# Patient Record
Sex: Female | Born: 1970 | Race: Black or African American | Hispanic: No | Marital: Married | Smoking: Never smoker
Health system: Southern US, Community
[De-identification: ages and names within clinical notes are randomized; demographics above are authoritative.]

## PROBLEM LIST (undated history)

## (undated) DIAGNOSIS — E870 Hyperosmolality and hypernatremia: Secondary | ICD-10-CM

## (undated) DIAGNOSIS — D509 Iron deficiency anemia, unspecified: Secondary | ICD-10-CM

## (undated) DIAGNOSIS — I10 Essential (primary) hypertension: Secondary | ICD-10-CM

## (undated) DIAGNOSIS — E559 Vitamin D deficiency, unspecified: Secondary | ICD-10-CM

## (undated) HISTORY — DX: Essential (primary) hypertension: I10

## (undated) HISTORY — DX: Hyperosmolality and hypernatremia: E87.0

## (undated) HISTORY — PX: CYST EXCISION: SHX5701

## (undated) HISTORY — DX: Vitamin D deficiency, unspecified: E55.9

## (undated) HISTORY — DX: Iron deficiency anemia, unspecified: D50.9

## (undated) HISTORY — PX: TUBAL LIGATION: SHX77

---

## 2012-06-11 ENCOUNTER — Encounter: Payer: Self-pay | Admitting: *Deleted

## 2012-06-12 ENCOUNTER — Institutional Professional Consult (permissible substitution): Payer: Self-pay | Admitting: Family Medicine

## 2012-06-12 ENCOUNTER — Encounter: Payer: Self-pay | Admitting: *Deleted

## 2012-07-07 ENCOUNTER — Encounter: Payer: Self-pay | Admitting: Family Medicine

## 2012-07-07 ENCOUNTER — Ambulatory Visit (INDEPENDENT_AMBULATORY_CARE_PROVIDER_SITE_OTHER): Payer: BC Managed Care – PPO | Admitting: Family Medicine

## 2012-07-07 VITALS — BP 122/84 | HR 80 | Ht 68.0 in | Wt 178.0 lb

## 2012-07-07 DIAGNOSIS — R5383 Other fatigue: Secondary | ICD-10-CM

## 2012-07-07 DIAGNOSIS — N920 Excessive and frequent menstruation with regular cycle: Secondary | ICD-10-CM

## 2012-07-07 DIAGNOSIS — Z13220 Encounter for screening for lipoid disorders: Secondary | ICD-10-CM

## 2012-07-07 DIAGNOSIS — E559 Vitamin D deficiency, unspecified: Secondary | ICD-10-CM

## 2012-07-07 DIAGNOSIS — I10 Essential (primary) hypertension: Secondary | ICD-10-CM

## 2012-07-07 DIAGNOSIS — Z23 Encounter for immunization: Secondary | ICD-10-CM

## 2012-07-07 MED ORDER — AMLODIPINE BESYLATE 5 MG PO TABS: 5.0000 mg | ORAL_TABLET | Freq: Every day | ORAL | Status: DC

## 2012-07-07 NOTE — Progress Notes (Signed)
Chief Complaint  Patient presents with  . Establish Care    and discuss bp.    HPI: patient presents to establish care.  Wondering if she will ever be able to come off of her BP meds.  She has been on medication for 2 years.  BP's at pharmacy run 125/75-80. She denies headaches, dizziness, chest pain, palpitations or edema.  She denies any side effects to her medications.  Previously was followed by clinic in [place]--records are available.  Last labs approx 2 years ago.  Menses--heavy x first 2 days, occur monthly.  H/o anemia in past (mild).  H/o vitamin D deficiency in past.  Past Medical History  Diagnosis Date  . Hypertension   . Serum sodium elevated     hx of  . Unspecified vitamin D deficiency    Past Surgical History  Procedure Date  . Tubal ligation   . Cyst excision     left shoulder and left leg   Family History  Problem Relation Age of Onset  . Diabetes Mother   . Hyperlipidemia Mother   . Hypertension Mother   . Arthritis Mother   . Diabetes Father   . Hyperlipidemia Father   . Hypertension Father   . Coronary artery disease Maternal Aunt     died at 74  . Diabetes Maternal Grandmother   . Arthritis Maternal Grandmother   . Coronary artery disease Maternal Grandfather     50's  . Diabetes Maternal Grandfather   . Diabetes Paternal Grandmother   . Cancer Paternal Grandfather     prostate  . Diabetes Paternal Grandfather    History   Social History  . Marital Status: Divorced    Spouse Name: N/A    Number of Children: 2  . Years of Education: N/A   Occupational History  . [employer]    Social History Main Topics  . Smoking status: Never Smoker   . Smokeless tobacco: Never Used  . Alcohol Use: No  . Drug Use: No  . Sexually Active: Not on file   Other Topics Concern  . Not on file   Social History Narrative   Lives with her 2 daughters.  Getting divorced (February 2014)   Current outpatient prescriptions:amLODipine  (NORVASC) 5 MG tablet, Take 1 tablet (5 mg total) by mouth daily., Disp: 90 tablet, Rfl: 3  No Known Allergies  ROS:  Denies fevers, URI symptoms, cough, shortness of breath, chest pain, nausea, vomiting, diarrhea, joint pains, urinary complaints, bleeding, bruising, edema, depression, anxiety, headaches, dizziness, or other concerns.  See HPI  PHYSICAL EXAM: BP 122/84  Pulse 80  Ht 5\' 8"  (1.727 m)  Wt 178 lb (80.74  kg)  BMI 27.06 kg/m2  LMP 06/29/2012 Well developed, pleasant female in no distress HEENT:  PERRL, EOMI, conjunctiva clear.  TM's and EAC's normal.  OP clear Neck: no lymphadenopathy, thyromegaly, mass or carotid bruit Heart: regular rate and rhythm without murmur Lungs: clear bilaterally without murmur Abdomen: soft, nontender, no organomegaly or mass Extremities: no edema, 2+ pulse Neuro: alert and oriented.  Cranial nerves grossly intact.  Normal strength, sensation, gait Psych: normal mood, affect, hygiene and grooming Skin: no visible rashes  ASSESSMENT/PLAN: 1. Essential hypertension, benign  Lipid panel, Comprehensive metabolic panel, amLODipine (NORVASC) 5 MG tablet  2. Unspecified vitamin D deficiency  Vitamin D 25 hydroxy  3. Heavy menses  CBC with Differential, TSH  4. Other malaise and fatigue  CBC with Differential, TSH  5. Screening  for lipoid disorders  Lipid panel  6. Need for prophylactic vaccination and inoculation against influenza  Flu vaccine greater than or equal to 3yo preservative free IM   HTN--well controlled, on medications.  Discussed reasons for continuing meds.  Advised that if BP's are persistently <100-110, or any dizziness, can try cutting tablet in half for 2-4 weeks while continuing to monitor BP's, and to increase dose back to full tablet if BP' rise >135-140/85-90.  Given that BP is perfectly controlled, and she isn't having any side effects, recommended that she remain at current dose, rather than trying to taper down dose.  Flu  shot today--pt convinced after counseling to get; encouraged to get in the fall in the future.  Records reviewed Will need TdaP at CPE in March  Return in March, as scheduled for CPE

## 2012-07-07 NOTE — Patient Instructions (Signed)
Continue to take your blood pressure medications. If you are having dizziness and BP consistently <100-110 systolic, let us know.    Expect to get a TdaP booster at your physical.  Return prior to your physical for fasting labs

## 2012-08-14 ENCOUNTER — Encounter: Payer: Self-pay | Admitting: Family Medicine

## 2012-08-14 ENCOUNTER — Ambulatory Visit (INDEPENDENT_AMBULATORY_CARE_PROVIDER_SITE_OTHER): Payer: BC Managed Care – PPO | Admitting: Family Medicine

## 2012-08-14 VITALS — BP 122/86 | HR 94 | Wt 185.0 lb

## 2012-08-14 DIAGNOSIS — M25519 Pain in unspecified shoulder: Secondary | ICD-10-CM

## 2012-08-14 NOTE — Progress Notes (Signed)
  Subjective:    Patient ID: Isabella Rodgers, female    DOB: 1970/12/28, 42 y.o.   MRN: [id number]  HPI She is here for evaluation of a five-day history of left shoulder pain. She states that the pain does radiate to the shoulder. Movement of the shoulder does make it worse. She does have a previous history of excision of a lipoma from the left shoulder.   Review of Systems     Objective:   Physical Exam Surgical scars noted. She is exquisitely tender over the distal aspect of the scar. Good motion of the shoulder passively without pain. No laxity noted. No tenderness to palpation of the joint.no joint laxity noted.       Assessment & Plan:  Left shoulder pain since he is exquisitely tender over the distal aspect of scarring wondering if she is getting ready to get out a suture. I explained this to her. Recommend conservative care at this point.

## 2012-09-08 ENCOUNTER — Other Ambulatory Visit: Payer: Self-pay | Admitting: Family Medicine

## 2012-09-08 ENCOUNTER — Other Ambulatory Visit: Payer: BC Managed Care – PPO

## 2012-09-08 DIAGNOSIS — N920 Excessive and frequent menstruation with regular cycle: Secondary | ICD-10-CM

## 2012-09-08 LAB — COMPREHENSIVE METABOLIC PANEL
Albumin: 4.1 g/dL (ref 3.5–5.2)
BUN: 15 mg/dL (ref 6–23)
CO2: 25 mEq/L (ref 19–32)
Glucose, Bld: 75 mg/dL (ref 70–99)
Potassium: 4.4 mEq/L (ref 3.5–5.3)
Sodium: 136 mEq/L (ref 135–145)
Total Protein: 7 g/dL (ref 6.0–8.3)

## 2012-09-08 LAB — CBC WITH DIFFERENTIAL/PLATELET
Basophils Absolute: 0 10*3/uL (ref 0.0–0.1)
Eosinophils Absolute: 0.1 10*3/uL (ref 0.0–0.7)
Eosinophils Relative: 1 % (ref 0–5)
Lymphocytes Relative: 35 % (ref 12–46)
Lymphs Abs: 2.3 10*3/uL (ref 0.7–4.0)
MCV: 79 fL (ref 78.0–100.0)
Neutrophils Relative %: 56 % (ref 43–77)
Platelets: 246 10*3/uL (ref 150–400)
RBC: 4.14 MIL/uL (ref 3.87–5.11)
RDW: 14.6 % (ref 11.5–15.5)
WBC: 6.6 10*3/uL (ref 4.0–10.5)

## 2012-09-08 LAB — LIPID PANEL
Cholesterol: 145 mg/dL (ref 0–200)
HDL: 53 mg/dL (ref >39)

## 2012-09-08 LAB — TSH: TSH: 1.267 u[IU]/mL (ref 0.350–4.500)

## 2012-09-09 LAB — FOLATE: Folate: >20 ng/mL

## 2012-09-09 LAB — VITAMIN B12: Vitamin B-12: 607 pg/mL (ref 211–911)

## 2012-09-09 LAB — FERRITIN: Ferritin: 2 ng/mL — ABNORMAL LOW (ref 10–291)

## 2012-09-09 LAB — VITAMIN D 25 HYDROXY (VIT D DEFICIENCY, FRACTURES): Vit D, 25-Hydroxy: 19 ng/mL — ABNORMAL LOW (ref 30–89)

## 2012-09-17 ENCOUNTER — Encounter: Payer: Self-pay | Admitting: Family Medicine

## 2012-09-17 ENCOUNTER — Ambulatory Visit (INDEPENDENT_AMBULATORY_CARE_PROVIDER_SITE_OTHER): Payer: BC Managed Care – PPO | Admitting: Family Medicine

## 2012-09-17 VITALS — BP 120/84 | HR 72 | Ht 68.0 in | Wt 188.0 lb

## 2012-09-17 DIAGNOSIS — D509 Iron deficiency anemia, unspecified: Secondary | ICD-10-CM

## 2012-09-17 DIAGNOSIS — I10 Essential (primary) hypertension: Secondary | ICD-10-CM

## 2012-09-17 DIAGNOSIS — Z0000 Encounter for general adult medical examination without abnormal findings: Secondary | ICD-10-CM

## 2012-09-17 DIAGNOSIS — Z23 Encounter for immunization: Secondary | ICD-10-CM

## 2012-09-17 DIAGNOSIS — E559 Vitamin D deficiency, unspecified: Secondary | ICD-10-CM

## 2012-09-17 LAB — POCT URINALYSIS DIPSTICK
Blood, UA: NEGATIVE
Nitrite, UA: NEGATIVE
Protein, UA: NEGATIVE
Spec Grav, UA: 1.02
Urobilinogen, UA: NEGATIVE
pH, UA: 5

## 2012-09-17 MED ORDER — ERGOCALCIFEROL 1.25 MG (50000 UT) PO CAPS: 50000.0000 [IU] | ORAL_CAPSULE | ORAL | Status: DC

## 2012-09-17 NOTE — Patient Instructions (Signed)
HEALTH MAINTENANCE RECOMMENDATIONS:  It is recommended that you get at least 30 minutes of aerobic exercise at least 5 days/week (for weight loss, you may need as much as 60-90 minutes). This can be any activity that gets your heart rate up. This can be divided in 10-15 minute intervals if needed, but try and build up your endurance at least once a week.  Weight bearing exercise is also recommended twice weekly.  Eat a healthy diet with lots of vegetables, fruits and fiber.  "Colorful" foods have a lot of vitamins (ie green vegetables, tomatoes, red peppers, etc).  Limit sweet tea, regular sodas and alcoholic beverages, all of which has a lot of calories and sugar.  Up to 1 alcoholic drink daily may be beneficial for women (unless trying to lose weight, watch sugars).  Drink a lot of water.  Calcium recommendations are 1200-1500 mg daily (1500 mg for postmenopausal women or women without ovaries), and vitamin D 1000 IU daily.  This should be obtained from diet and/or supplements (vitamins), and calcium should not be taken all at once, but in divided doses.  Monthly self breast exams and yearly mammograms for women over the age of 65 is recommended.  Sunscreen of at least SPF 30 should be used on all sun-exposed parts of the skin when outside between the hours of 10 am and 4 pm (not just when at beach or pool, but even with exercise, golf, tennis, and yard work!)  Use a sunscreen that says "broad spectrum" so it covers both UVA and UVB rays, and make sure to reapply every 1-2 hours.  Remember to change the batteries in your smoke detectors when changing your clock times in the spring and fall.  Use your seat belt every time you are in a car, and please drive safely and not be distracted with cell phones and texting while driving.   Vitamin D-OH 19 Lab Results  Component Value Date   WBC 6.6 09/08/2012   HGB 10.7* 09/08/2012   HCT 32.7* 09/08/2012   MCV 79.0 09/08/2012   PLT 246 09/08/2012   Lab  Results  Component Value Date   IRON 36* 09/08/2012   FERRITIN 2* 09/08/2012   Normal B12 and folate    Chemistry      Component Value Date/Time   NA 136 09/08/2012 0843   K 4.4 09/08/2012 0843   CL 104 09/08/2012 0843   CO2 25 09/08/2012 0843   BUN 15 09/08/2012 0843   CREATININE 0.69 09/08/2012 0843      Component Value Date/Time   CALCIUM 8.7 09/08/2012 0843   ALKPHOS 61 09/08/2012 0843   AST 25 09/08/2012 0843   ALT 30 09/08/2012 0843   BILITOT 0.3 09/08/2012 0843     Lab Results  Component Value Date   TSH 1.267 09/08/2012   Lab Results  Component Value Date   CHOL 145 09/08/2012   HDL 53 09/08/2012   LDLCALC 83 09/08/2012   TRIG 43 09/08/2012   CHOLHDL 2.7 09/08/2012    Take prescription vitamin D once weekly for 3 months. Once you finish the prescription, make sure to be taking 1000-2000 IU daily (between all sources--ie calcium +D, vs separate D vs multi-vitamin).  Start taking OTC ferrous sulfate (324 mg) once daily with food.  It is absorbed better if taken with orange juice.  After 3-4 months of taking iron daily, you can likely switch to just taking it while you are on your menstrual cycle.  If you  develop constipation from the iron supplement, take Colace daily along with it (stool softener).

## 2012-09-17 NOTE — Progress Notes (Signed)
Chief Complaint  Patient presents with  . Annual Exam    nonfasting annual exam with pap. Would like to discuss appetite suppressant.    Isabella Rodgers is a 42 y.o. female who presents for a complete physical.  She has the following concerns: 10 pound weight gain since last visit 2 months ago.  She admits to exercising less recently, and stress eating, related to stress of pending divorce.  Denies hair/skin changes, periods are regular.   She had labs prior to visit.  Some fatigue.  Periods a little heavy.  Health Maintenance: Immunization History  Administered Date(s) Administered  . Influenza, Seasonal, Injecte, Preservative Fre 07/07/2012  . Tdap 09/17/2012  last tetanus unknown--thinks she got when she stepped on a nail, through [facility], but records reviewed and no record found Last Pap smear:  Fall 2012.  H/o cervical polyps, which were removed.  Denies abnormal paps Last mammogram: several years ago Last colonoscopy: never Last DEXA: never Dentist: twice yearly Ophtho: yearly Exercise:  3x/week for an hour  Past Medical History  Diagnosis Date  . Hypertension   . Serum sodium elevated     hx of  . Unspecified vitamin D deficiency   . Iron deficiency anemia     Past Surgical History  Procedure Laterality Date  . Tubal ligation    . Cyst excision      left shoulder and left leg    History   Social History  . Marital Status: Divorced    Spouse Name: N/A    Number of Children: 2  . Years of Education: N/A   Occupational History  . [employer]    Social History Main Topics  . Smoking status: Never Smoker   . Smokeless tobacco: Never Used  . Alcohol Use: No  . Drug Use: No  . Sexually Active: Yes -- Female partner(s)   Other Topics Concern  . Not on file   Social History Narrative   Lives with her 2 daughters.  Getting divorced (April 2014)    Family History  Problem Relation Age of Onset  . Diabetes Mother   .  Hyperlipidemia Mother   . Hypertension Mother   . Arthritis Mother   . Diabetes Father   . Hyperlipidemia Father   . Hypertension Father   . Coronary artery disease Maternal Aunt     died at 38  . Diabetes Maternal Grandmother   . Arthritis Maternal Grandmother   . Coronary artery disease Maternal Grandfather     50's  . Diabetes Maternal Grandfather   . Diabetes Paternal Grandmother   . Cancer Paternal Grandfather     prostate  . Diabetes Paternal Grandfather    Current Outpatient Prescriptions on File Prior to Visit  Medication Sig Dispense Refill  . amLODipine (NORVASC) 5 MG tablet Take 1 tablet (5 mg total) by mouth daily.  90 tablet  3   No current facility-administered medications on file prior to visit.   No Known Allergies  ROS:  The patient denies anorexia, fever, headaches,  vision changes, decreased hearing, ear pain, sore throat, breast concerns, chest pain, palpitations, dizziness, syncope, dyspnea on exertion, cough, swelling, nausea, vomiting, diarrhea, constipation, abdominal pain, melena, hematochezia, indigestion/heartburn, hematuria, incontinence, dysuria, irregular menstrual cycles, vaginal discharge, odor or itch, genital lesions, joint pains, numbness, tingling, weakness, tremor, suspicious skin lesions, depression, anxiety, abnormal bleeding/bruising, or enlarged lymph nodes. +weight gain (see HPI), slight fatigue.     PHYSICAL EXAM: BP 120/84  Pulse 72  Ht 5\' 8"  (1.727 m)  Wt 188 lb (85.276 kg)  BMI 28.59 kg/m2  LMP 08/24/2012  General Appearance:    Alert, cooperative, no distress, appears stated age  Head:    Normocephalic, without obvious abnormality, atraumatic  Eyes:    PERRL, conjunctiva/corneas clear, EOM's intact, fundi    benign  Ears:    Normal TM's and external ear canals  Nose:   Nares normal, mucosa normal, no drainage or sinus   tenderness  Throat:   Lips, mucosa, and tongue normal; teeth and gums normal  Neck:   Supple, no  lymphadenopathy;  thyroid:  no   enlargement/tenderness/nodules; no carotid   bruit or JVD  Back:    Spine nontender, no curvature, ROM normal, no CVA     tenderness  Lungs:     Clear to auscultation bilaterally without wheezes, rales or     ronchi; respirations unlabored  Chest Wall:    No tenderness or deformity   Heart:    Regular rate and rhythm, S1 and S2 normal, no murmur, rub   or gallop  Breast Exam:    No tenderness, masses, or nipple discharge or inversion.      No axillary lymphadenopathy  Abdomen:     Soft, non-tender, nondistended, normoactive bowel sounds,    no masses, no hepatosplenomegaly  Genitalia:    Normal external genitalia without lesions. No cervical motion tenderness. No abnormal vaginal discharge.  Uterus and adnexa not enlarged, nontender, no masses.  Pap not performed  Rectal:    Not performed due to age<40 and no related complaints  Extremities:   No clubbing, cyanosis or edema  Pulses:   2+ and symmetric all extremities  Skin:   Skin color, texture, turgor normal, no rashes or lesions  Lymph nodes:   Cervical, supraclavicular, and axillary nodes normal  Neurologic:   CNII-XII intact, normal strength, sensation and gait; reflexes 2+ and symmetric throughout          Psych:   Normal mood, affect, hygiene and grooming.    Lab results:  Vitamin D-OH 19 Lab Results  Component Value Date   WBC 6.6 09/08/2012   HGB 10.7* 09/08/2012   HCT 32.7* 09/08/2012   MCV 79.0 09/08/2012   PLT 246 09/08/2012   Lab Results  Component Value Date   IRON 36* 09/08/2012   FERRITIN 2* 09/08/2012   Normal B12 and folate    Chemistry      Component Value Date/Time   NA 136 09/08/2012 0843   K 4.4 09/08/2012 0843   CL 104 09/08/2012 0843   CO2 25 09/08/2012 0843   BUN 15 09/08/2012 0843   CREATININE 0.69 09/08/2012 0843      Component Value Date/Time   CALCIUM 8.7 09/08/2012 0843   ALKPHOS 61 09/08/2012 0843   AST 25 09/08/2012 0843   ALT 30 09/08/2012 0843   BILITOT 0.3  09/08/2012 0843     Lab Results  Component Value Date   TSH 1.267 09/08/2012   Lab Results  Component Value Date   CHOL 145 09/08/2012   HDL 53 09/08/2012   LDLCALC 83 09/08/2012   TRIG 43 09/08/2012   CHOLHDL 2.7 09/08/2012   ASSESSMENT/PLAN:  Routine general medical examination at a health care facility - Plan: Visual acuity screening, POCT Urinalysis Dipstick  Unspecified vitamin D deficiency - Plan: ergocalciferol (VITAMIN D2) 50000 UNITS capsule, Vitamin D 25 hydroxy  Essential hypertension, benign  Anemia, iron deficiency - Plan: CBC with Differential, Ferritin  Need for Tdap vaccination - Plan: Tdap vaccine greater than or equal to 7yo IM  Discussed monthly self breast exams and yearly mammograms after the age of 84; at least 30 minutes of aerobic activity at least 5 days/week; proper sunscreen use reviewed; healthy diet, including goals of calcium and vitamin D intake and alcohol recommendations (less than or equal to 1 drink/day) reviewed; regular seatbelt use; changing batteries in smoke detectors.  Immunization recommendations discussed--Tdap given today.  Colonoscopy recommendations reviewed, age 55, (sooner prn) Pap next year

## 2013-02-05 ENCOUNTER — Encounter: Payer: Self-pay | Admitting: Family Medicine

## 2013-02-05 ENCOUNTER — Ambulatory Visit (INDEPENDENT_AMBULATORY_CARE_PROVIDER_SITE_OTHER): Payer: BC Managed Care – PPO | Admitting: Family Medicine

## 2013-02-05 VITALS — BP 110/80 | HR 112 | Wt 194.0 lb

## 2013-02-05 DIAGNOSIS — R51 Headache: Secondary | ICD-10-CM

## 2013-02-05 NOTE — Patient Instructions (Signed)
800 mg of ibuprofen 3 times a day for the next 7-10 days. If this doesn't work then we will proceed further

## 2013-02-05 NOTE — Progress Notes (Signed)
  Subjective:    Patient ID: Isabella Rodgers, female    DOB: [birth year], 42 y.o.   MRN: [id number]  HPI She has a ten-day history of sharp stabbing left temporal pain. The pain lasts for 30 seconds and then goes away. She tends to notice them more often at night . He can get 5 or 6 of these during the day but more frequently at night. No blurred vision, nausea double vision, unilateral diaphoresis,, weakness, numbness or tingling. No previous history of this. She is a few days away from starting her next menstrual cycle. She has no history of headaches/migraine/family history of having headaches. She does not smoke or drink. Her medications were reviewed.   Review of Systems     Objective:   Physical Exam alert and in no distress. Tympanic membranes and canals are normal. Throat is clear. Tonsils are normal. Neck is supple without adenopathy or thyromegaly. Cardiac exam shows a regular sinus rhythm without murmurs or gallops. Lungs are clear to auscultation. EOMI. DTRs normal other cranial nerves intact. No tenderness to palpation over the temporal artery.        Assessment & Plan:  Headache, temporal  I explained that her headache did not fit a particular pattern but did have cluster component to it. I will initially treat her with 800 mg 3 times a day of Motrin. She will let me know how she tolerates this

## 2013-03-16 ENCOUNTER — Other Ambulatory Visit: Payer: BC Managed Care – PPO

## 2013-03-16 DIAGNOSIS — D509 Iron deficiency anemia, unspecified: Secondary | ICD-10-CM

## 2013-03-16 DIAGNOSIS — E559 Vitamin D deficiency, unspecified: Secondary | ICD-10-CM

## 2013-03-16 LAB — CBC WITH DIFFERENTIAL/PLATELET
Basophils Absolute: 0 10*3/uL (ref 0.0–0.1)
Basophils Relative: 0 % (ref 0–1)
Eosinophils Absolute: 0.1 10*3/uL (ref 0.0–0.7)
HCT: 34.1 % — ABNORMAL LOW (ref 36.0–46.0)
Hemoglobin: 11.1 g/dL — ABNORMAL LOW (ref 12.0–15.0)
MCH: 27.1 pg (ref 26.0–34.0)
MCHC: 32.6 g/dL (ref 30.0–36.0)
Monocytes Absolute: 0.3 10*3/uL (ref 0.1–1.0)
Monocytes Relative: 5 % (ref 3–12)
RDW: 14.2 % (ref 11.5–15.5)

## 2013-03-31 ENCOUNTER — Encounter: Payer: Self-pay | Admitting: Family Medicine

## 2013-04-02 ENCOUNTER — Other Ambulatory Visit: Payer: Self-pay | Admitting: Family Medicine

## 2013-04-08 ENCOUNTER — Encounter: Payer: Self-pay | Admitting: Family Medicine

## 2013-04-08 ENCOUNTER — Ambulatory Visit (INDEPENDENT_AMBULATORY_CARE_PROVIDER_SITE_OTHER): Payer: BC Managed Care – PPO | Admitting: Family Medicine

## 2013-04-08 VITALS — BP 134/92 | HR 80 | Ht 69.0 in | Wt 200.0 lb

## 2013-04-08 DIAGNOSIS — I10 Essential (primary) hypertension: Secondary | ICD-10-CM

## 2013-04-08 DIAGNOSIS — D509 Iron deficiency anemia, unspecified: Secondary | ICD-10-CM

## 2013-04-08 DIAGNOSIS — E559 Vitamin D deficiency, unspecified: Secondary | ICD-10-CM

## 2013-04-08 MED ORDER — ERGOCALCIFEROL 1.25 MG (50000 UT) PO CAPS: 50000.0000 [IU] | ORAL_CAPSULE | ORAL | Status: DC

## 2013-04-08 NOTE — Patient Instructions (Addendum)
Vitamin D deficiency:  Refill rx for another 3 months of weekly therapy.  Once the prescription is completed, start taking 1000 IU of over-the-counter Vitamin D3 (this can be in addition to your daily Viactiv chew).  Anemia:  Likely related to heavy and frequent periods.  Restart iron--start at twice daily for a month.  Take with food or orange juice.  Consider taking stool softener along with the iron to prevent constipation.  After a month, you can cut back to taking iron just once daily, and only using twice daily on the heavy days of your periods.  Consider following up with your gynecologist to discuss options to help regulate the frequency and/or heaviness of your cycles (ie ablation, hormonal treatments, mirena IUD, etc).  Please return stool cards--looking for microscopic blood losses from the bowels, rather than blaming the menstrual losses as the sole cause of the anemia.  Do this when you are NOT having your period, any hemorrhoidal bleeding or other problems.  Get flu shot at work

## 2013-04-08 NOTE — Progress Notes (Signed)
Chief Complaint  Patient presents with  . discuss labs    discuss labs,   Patient presents to discuss her lab results.  She had labs to follow up on her vitamin D deficiency and iron deficiency, and was not found to be significantly improved.  Vitamin D deficiency.  She took the 3 months of prescription replacement, and since then has been taking 1 Viactiv chew daily. No other additional OTC vitamin D supplementation being taken.  Anemia--iron deficient:  She denies dizziness, fatigue or shortness of breath.  She reports that her periods were very frequent over the summer (8/16, then first week in September, then another one in the end of September).  First two days of her cycle are very heavy.  Last about 5 days.  She has iron on her nightstand, but hasn't been taking it.  She took it once daily for the three months that she was taking the prescription vitamin D.  She has had her tubes tied. She hasn't seen GYN in 2 years. She denies any pelvic pain.  Hypertension:  Doesn't check BP elsewhere.  Hasn't taken her amlodipine today or yesterday--misplaced it in the process of a move. Should be getting another refill in the mail any day now. Denies headache today.  Had one Monday when moving, much milder than at her last visit with Dr. Susann Givens.  No dizziness, chest pain, edema or other concerns.  Past Medical History  Diagnosis Date  . Hypertension   . Serum sodium elevated     hx of  . Unspecified vitamin D deficiency   . Iron deficiency anemia    Past Surgical History  Procedure Laterality Date  . Tubal ligation    . Cyst excision      left shoulder and left leg   History   Social History  . Marital Status: Married    Spouse Name: N/A    Number of Children: 2  . Years of Education: N/A   Occupational History  . [employer]    Social History Main Topics  . Smoking status: Never Smoker   . Smokeless tobacco: Never Used  . Alcohol Use: No  . Drug Use: No  . Sexual  Activity: Yes    Partners: Male   Other Topics Concern  . Not on file   Social History Narrative   Lives with her husband, and youngest daughter Joycelyn Rua); oldest daughter is in college in [place].  Divorced 07/2012; Remarried 01/2013   Current Outpatient Prescriptions on File Prior to Visit  Medication Sig Dispense Refill  . amLODipine (NORVASC) 5 MG tablet Take 1 tablet (5 mg total) by mouth daily.  90 tablet  3   No current facility-administered medications on file prior to visit.  Viactiv--one chew daily.  No Known Allergies  ROS:  Denies fevers, URI symptoms, chest pain, cough, shortness of breath, edema, dizziness.  No headache today.  No GI complaints, GU complaints other than heavy periods, and increase in frequency in the last 2 months.  Denies other bleeding, bruising, bowel change, rashes.  Moods are good.  +weight gain noted  PHYSICAL EXAM: BP 134/92  Pulse 80  Ht 5\' 9"  (1.753 m)  Wt 200 lb (90.719 kg)  BMI 29.52 kg/m2 134/92 on repeat by MD Very pleasant female, in no distress HEENT: PERRL, EOMI, conjunctiva clear Neck: no lymphadenopathy, thyromegaly or mass Heart: regular rate and rhythm without murmur Lungs: clear bilaterally Extremities: no edema Neuro: alert and oriented, normal gait, cranial nerves  Lab Results  Component Value Date   WBC 6.3 03/16/2013   HGB 11.1* 03/16/2013   HCT 34.1* 03/16/2013   MCV 83.4 03/16/2013   PLT 222 03/16/2013   Lab Results  Component Value Date   FERRITIN 5* 03/16/2013   Vitamin D-OH 22  ASSESSMENT/PLAN:  Unspecified vitamin D deficiency - repeat 12 week prescription course, followed by higher amt of OTC Vitamin D longterm (1000 IU plus the viactiv daily). recheck 3 mos - Plan: ergocalciferol (VITAMIN D2) 50000 UNITS capsule, Vit D  25 hydroxy (rtn osteoporosis monitoring)  Anemia, iron deficiency - likely related to heavy/frequent menses.  given hemoccult kit to r/o other causes.  take iron BID x 1 month, then qd (but  BID when on menses).  recheck in 3 mos - Plan: CBC with Differential, Ferritin  Essential hypertension, benign - elevated today, likely related to not taking meds x 2 days.  restart meds.  discussed exercise, weight loss, low sodium diet  Vitamin D deficiency:  Refill rx for another 3 months of weekly therapy.  Once the prescription is completed, start taking 1000 IU of over-the-counter Vitamin D3 (this can be in addition to your daily Viactiv chew).  Anemia:  Likely related to heavy and frequent periods.  Restart iron--start at twice daily for a month.  Take with food or orange juice.  Consider taking stool softener along with the iron to prevent constipation.  After a month, you can cut back to taking iron just once daily, and only using twice daily on the heavy days of your periods.  Consider following up with your gynecologist to discuss options to help regulate the frequency and/or heaviness of your cycles (ie ablation, hormonal treatments, mirena IUD, etc).  Please return stool cards--looking for microscopic blood losses from the bowels, rather than blaming the menstrual losses as the sole cause of the anemia.  Do this when you are NOT having your period, any hemorrhoidal bleeding or other problems.  CBC, ferritin, Vitamin D in 3 months   25 minutes, more than 1/2 spent counseling

## 2013-04-14 ENCOUNTER — Other Ambulatory Visit (INDEPENDENT_AMBULATORY_CARE_PROVIDER_SITE_OTHER): Payer: BC Managed Care – PPO

## 2013-04-14 DIAGNOSIS — Z1211 Encounter for screening for malignant neoplasm of colon: Secondary | ICD-10-CM

## 2013-04-14 LAB — HEMOCCULT GUIAC POC 1CARD (OFFICE): Fecal Occult Blood, POC: NEGATIVE

## 2013-04-30 ENCOUNTER — Other Ambulatory Visit: Payer: Self-pay

## 2013-09-06 ENCOUNTER — Other Ambulatory Visit: Payer: Self-pay | Admitting: Family Medicine

## 2013-09-07 ENCOUNTER — Other Ambulatory Visit: Payer: Self-pay | Admitting: *Deleted

## 2013-09-07 DIAGNOSIS — E559 Vitamin D deficiency, unspecified: Secondary | ICD-10-CM

## 2013-09-07 DIAGNOSIS — D509 Iron deficiency anemia, unspecified: Secondary | ICD-10-CM

## 2013-09-11 ENCOUNTER — Other Ambulatory Visit: Payer: BC Managed Care – PPO

## 2013-09-11 DIAGNOSIS — D509 Iron deficiency anemia, unspecified: Secondary | ICD-10-CM

## 2013-09-11 DIAGNOSIS — E559 Vitamin D deficiency, unspecified: Secondary | ICD-10-CM

## 2013-09-11 LAB — CBC WITH DIFFERENTIAL/PLATELET
Basophils Absolute: 0 10*3/uL (ref 0.0–0.1)
Basophils Relative: 0 % (ref 0–1)
EOS ABS: 0.2 10*3/uL (ref 0.0–0.7)
Eosinophils Relative: 2 % (ref 0–5)
HCT: 36.6 % (ref 36.0–46.0)
Hemoglobin: 12.1 g/dL (ref 12.0–15.0)
LYMPHS PCT: 37 % (ref 12–46)
Lymphs Abs: 2.8 10*3/uL (ref 0.7–4.0)
MCH: 27.6 pg (ref 26.0–34.0)
MCHC: 33.1 g/dL (ref 30.0–36.0)
MCV: 83.6 fL (ref 78.0–100.0)
Monocytes Absolute: 0.5 10*3/uL (ref 0.1–1.0)
Monocytes Relative: 6 % (ref 3–12)
NEUTROS PCT: 55 % (ref 43–77)
Neutro Abs: 4.1 10*3/uL (ref 1.7–7.7)
PLATELETS: 260 10*3/uL (ref 150–400)
RBC: 4.38 MIL/uL (ref 3.87–5.11)
RDW: 13.5 % (ref 11.5–15.5)
WBC: 7.5 10*3/uL (ref 4.0–10.5)

## 2013-09-12 LAB — FERRITIN: FERRITIN: 16 ng/mL (ref 10–291)

## 2013-09-12 LAB — VITAMIN D 25 HYDROXY (VIT D DEFICIENCY, FRACTURES): Vit D, 25-Hydroxy: 28 ng/mL — ABNORMAL LOW (ref 30–89)

## 2013-09-13 ENCOUNTER — Encounter: Payer: Self-pay | Admitting: Family Medicine

## 2013-09-28 HISTORY — PX: ENDOMETRIAL ABLATION: SHX621

## 2013-10-19 ENCOUNTER — Ambulatory Visit (INDEPENDENT_AMBULATORY_CARE_PROVIDER_SITE_OTHER): Payer: BC Managed Care – PPO | Admitting: Family Medicine

## 2013-10-19 ENCOUNTER — Encounter: Payer: Self-pay | Admitting: Family Medicine

## 2013-10-19 VITALS — BP 130/88 | HR 68 | Ht 69.0 in | Wt 204.0 lb

## 2013-10-19 DIAGNOSIS — D509 Iron deficiency anemia, unspecified: Secondary | ICD-10-CM

## 2013-10-19 DIAGNOSIS — I10 Essential (primary) hypertension: Secondary | ICD-10-CM

## 2013-10-19 DIAGNOSIS — E559 Vitamin D deficiency, unspecified: Secondary | ICD-10-CM

## 2013-10-19 LAB — COMPREHENSIVE METABOLIC PANEL
ALK PHOS: 65 U/L (ref 39–117)
ALT: 15 U/L (ref 0–35)
AST: 17 U/L (ref 0–37)
Albumin: 3.9 g/dL (ref 3.5–5.2)
BILIRUBIN TOTAL: 0.2 mg/dL (ref 0.2–1.2)
BUN: 18 mg/dL (ref 6–23)
CO2: 26 mEq/L (ref 19–32)
Calcium: 9 mg/dL (ref 8.4–10.5)
Chloride: 105 mEq/L (ref 96–112)
Creat: 0.69 mg/dL (ref 0.50–1.10)
GLUCOSE: 90 mg/dL (ref 70–99)
Potassium: 4.2 mEq/L (ref 3.5–5.3)
Sodium: 137 mEq/L (ref 135–145)
Total Protein: 6.9 g/dL (ref 6.0–8.3)

## 2013-10-19 MED ORDER — AMLODIPINE BESYLATE 5 MG PO TABS: 5.0000 mg | ORAL_TABLET | Freq: Every day | ORAL | Status: DC

## 2013-10-19 NOTE — Progress Notes (Signed)
Chief Complaint  Patient presents with  . Hypertension    nonfasting med check.    Hypertension follow-up:  Blood pressures elsewhere are 127/70's last week at [facility].  Denies dizziness, headaches, chest pain.  Denies side effects of medications.  Iron deficiency anemia: She had endometrial ablation 3 weeks ago, complicated by infection, treated with Augmentin, and now doing well. This was done by her GYN in [place].  Vitamin D deficiency--she admits that she has not been taking any vitamin D supplements for quite a while.  Previously took viactiv.  Patient had labs done in March, presents to f/u on results.  Past Medical History  Diagnosis Date  . Hypertension   . Serum sodium elevated     hx of  . Unspecified vitamin D deficiency   . Iron deficiency anemia    Past Surgical History  Procedure Laterality Date  . Tubal ligation    . Cyst excision      left shoulder and left leg  . Endometrial ablation  09/28/2013   History   Social History  . Marital Status: Married    Spouse Name: N/A    Number of Children: 2  . Years of Education: N/A   Occupational History  . [employer]    Social History Main Topics  . Smoking status: Never Smoker   . Smokeless tobacco: Never Used  . Alcohol Use: No  . Drug Use: No  . Sexual Activity: Yes    Partners: Male   Other Topics Concern  . Not on file   Social History Narrative   Lives with her husband, and youngest daughter Isabella Rodgers); oldest daughter is in college in [place].  Divorced 07/2012; Remarried 01/2013   Outpatient Encounter Prescriptions as of 10/19/2013  Medication Sig Note  . amLODipine (NORVASC) 5 MG tablet Take 1 tablet (5 mg total) by mouth daily.   . Calcium-Vitamin D-Vitamin K (VIACTIV) 500-370-48 MG-UNT-MCG CHEW Chew 1 tablet by mouth daily. 10/19/2013: Hasn't taken in a while  . [DISCONTINUED] ergocalciferol (VITAMIN D2) 50000 UNITS capsule Take 1 capsule (50,000 Units total) by mouth once a week.    No  Known Allergies  ROS:  Denies fevers, chills, headaches, dizziness, chest pain, palpitations, URI symptoms, cough, shortness of breath, edema, GI complaints; currently without GU complaints.  Denies bleeding, bruising, rash  PHYSICAL EXAM: BP 130/88  Pulse 68  Ht _0  (1.753 m)  Wt 204 lb (92.534 kg)  BMI 30.11 kg/m2 120/86 on repeat by MD, [place] Well developed, pleasant female in no distress Neck: no lymphadenopathy, thyromegaly or mass Heart: regular rate and rhythm without murmur Lungs: clear bilaterally Abdomen: soft, nontender, no organomegaly or mass Extremities: no edema Psych: normal mood, affect, hygiene and grooming Neuro: alert and oriented.  Cranial nerves intact, normal strength, sensation, gait.   Lab Results  Component Value Date   WBC 7.5 09/11/2013   HGB 12.1 09/11/2013   HCT 36.6 09/11/2013   MCV 83.6 09/11/2013   PLT 260 09/11/2013   Ferritin 16 (this was done prior to ablation)  Vitamin D-OH 28  ASSESSMENT/PLAN:  Essential hypertension, benign - controlled - Plan: amLODipine (NORVASC) 5 MG tablet, Comprehensive metabolic panel  Unspecified vitamin D deficiency - improved, mild.  restart OTC Vitamin D 1000-2000 IU daily.  discussed need for longterm replacement  Anemia, iron deficiency - this was resolving, even prior to her ablation   Last ate 4.5 hours ago Normal lipids last year  Check c-met otday   Ideally, f/u  in 6 months (f/u HTN, recheck vitamin D). If she is unable to get here, then as long as she is checking BP's elsewhere and are normal, and she is taking her vitamin D daily, then okay for CPE in 1 year

## 2013-10-19 NOTE — Patient Instructions (Signed)
Continue your current medications. Start taking a vitamin D supplement of 1000 IU (up to 2000).  You can take this as part of a calcium with D supplement if you aren't getting enough calcium in your diet; if you get 1200mg  of calcium daily in your diet, then just take a separate vitamin D.

## 2014-04-15 ENCOUNTER — Other Ambulatory Visit (INDEPENDENT_AMBULATORY_CARE_PROVIDER_SITE_OTHER): Payer: BC Managed Care – PPO

## 2014-04-15 DIAGNOSIS — Z23 Encounter for immunization: Secondary | ICD-10-CM

## 2014-04-16 ENCOUNTER — Other Ambulatory Visit: Payer: BC Managed Care – PPO

## 2014-04-26 ENCOUNTER — Encounter: Payer: Self-pay | Admitting: Family Medicine

## 2014-09-13 ENCOUNTER — Telehealth: Payer: Self-pay | Admitting: *Deleted

## 2014-09-13 NOTE — Telephone Encounter (Signed)
Since she is feeling back to normal, I recommend that she schedule visit to be seen this week (med check--since she hasn't been seen since 09/2013).  Have her periodically monitor her BP's and bring list to her visit.

## 2014-09-13 NOTE — Telephone Encounter (Signed)
Patient called and states that she had a little episode at work about an hour ago. Her left arm went numb for a few minutes and then about 5 minutes after she had trouble recalling simple words for a minute or two-called co-worker by wrong name. Her bp was 144/100 and then again a few minutes later 154/110, nurse at work took these. She feels fine now but wanted to call and let you know to see if there is anything she needs to do.

## 2014-09-27 ENCOUNTER — Encounter: Payer: Self-pay | Admitting: Family Medicine

## 2014-09-27 ENCOUNTER — Ambulatory Visit (INDEPENDENT_AMBULATORY_CARE_PROVIDER_SITE_OTHER): Payer: BLUE CROSS/BLUE SHIELD | Admitting: Family Medicine

## 2014-09-27 VITALS — BP 126/88 | HR 80 | Ht 69.0 in | Wt 212.0 lb

## 2014-09-27 DIAGNOSIS — E559 Vitamin D deficiency, unspecified: Secondary | ICD-10-CM

## 2014-09-27 DIAGNOSIS — D509 Iron deficiency anemia, unspecified: Secondary | ICD-10-CM | POA: Diagnosis not present

## 2014-09-27 DIAGNOSIS — R635 Abnormal weight gain: Secondary | ICD-10-CM

## 2014-09-27 DIAGNOSIS — I10 Essential (primary) hypertension: Secondary | ICD-10-CM | POA: Diagnosis not present

## 2014-09-27 LAB — CBC WITH DIFFERENTIAL/PLATELET
Basophils Absolute: 0 10*3/uL (ref 0.0–0.1)
Basophils Relative: 0 % (ref 0–1)
Eosinophils Absolute: 0.1 10*3/uL (ref 0.0–0.7)
Eosinophils Relative: 1 % (ref 0–5)
HCT: 39.5 % (ref 36.0–46.0)
HEMOGLOBIN: 13.1 g/dL (ref 12.0–15.0)
LYMPHS ABS: 3 10*3/uL (ref 0.7–4.0)
LYMPHS PCT: 36 % (ref 12–46)
MCH: 28.2 pg (ref 26.0–34.0)
MCHC: 33.2 g/dL (ref 30.0–36.0)
MCV: 84.9 fL (ref 78.0–100.0)
MONOS PCT: 6 % (ref 3–12)
MPV: 9.3 fL (ref 8.6–12.4)
Monocytes Absolute: 0.5 10*3/uL (ref 0.1–1.0)
NEUTROS PCT: 57 % (ref 43–77)
Neutro Abs: 4.8 10*3/uL (ref 1.7–7.7)
Platelets: 261 10*3/uL (ref 150–400)
RBC: 4.65 MIL/uL (ref 3.87–5.11)
RDW: 13.6 % (ref 11.5–15.5)
WBC: 8.4 10*3/uL (ref 4.0–10.5)

## 2014-09-27 LAB — LIPID PANEL
CHOLESTEROL: 135 mg/dL (ref 0–200)
HDL: 42 mg/dL — ABNORMAL LOW (ref >=46)
LDL Cholesterol: 76 mg/dL (ref 0–99)
Total CHOL/HDL Ratio: 3.2 Ratio
Triglycerides: 86 mg/dL (ref <150)
VLDL: 17 mg/dL (ref 0–40)

## 2014-09-27 LAB — COMPREHENSIVE METABOLIC PANEL
ALK PHOS: 59 U/L (ref 39–117)
ALT: 15 U/L (ref 0–35)
AST: 18 U/L (ref 0–37)
Albumin: 3.9 g/dL (ref 3.5–5.2)
BILIRUBIN TOTAL: 0.3 mg/dL (ref 0.2–1.2)
BUN: 12 mg/dL (ref 6–23)
CO2: 22 mEq/L (ref 19–32)
Calcium: 8.7 mg/dL (ref 8.4–10.5)
Chloride: 105 mEq/L (ref 96–112)
Creat: 0.71 mg/dL (ref 0.50–1.10)
Glucose, Bld: 77 mg/dL (ref 70–99)
Potassium: 4.7 mEq/L (ref 3.5–5.3)
SODIUM: 137 meq/L (ref 135–145)
TOTAL PROTEIN: 7.3 g/dL (ref 6.0–8.3)

## 2014-09-27 MED ORDER — AMLODIPINE BESYLATE 5 MG PO TABS: 7.5000 mg | ORAL_TABLET | Freq: Every day | ORAL | Status: DC

## 2014-09-27 NOTE — Progress Notes (Signed)
Chief Complaint  Patient presents with  . Hypertension    nonfasting med check. Patient did bring list of bp's with her today. States that numbers are better since phone call on 09/13/14. Was thinking about other factors that may have contributed to her "episode, " states that for the last few months she has had a few sharp pains/headaches.     Hypertension: She is compliant with taking the amlodipine daily.  Denies any side effects. She brings in a list of BP's over the last 2 weeks, ranging from 125/89 up to 154/110.  They are mostly 120's-130's/90.  Pulse ranges from 77-97.   She had an episode while she was on the phone talking to her boss (see documented phone call).  Her left arm was numb; she was talking on the phone and typing at the same time.  It felt tingly and weird, like it was asleep.  It lasted less than 5 minutes, no associated weakness.  She was on the phone with her boss, and she had trouble concentrating, couldn't think about what she was trying to say, but no trouble with her speech at all.  She went to the nurse at work, and that was when BP was 154/110.  She has had some headaches, but she "dismisses them" so can't tell us frequency.  Describes them as sharp pains in the frontal area, since December.  It would start with a sharp pain, then discomfort would linger, like a headache.  Relieved by ibuprofen after 30 minutes. This is located across the forehead.  Denies sniffles, sneeze, postnasal drip, just some last week when pollen was worse. She took an [facility], which helped. She has not been getting any regular exercise.  She should be starting with a Paramedic.  She had gotten down to 185# in December when she was exercising regularly and eating "rabbit food".  She hadn't been checking her blood pressures during that time.  Vitamin D deficiency:  She usually takes either a MVI or a Vitamin D 1000 IU, but has been out for the last month and a half or  so.  Iron deficiency anemia--had improved s/p ablation. She still has periods, they are light.  Denies fatigue, weakness, dizziness.  PMH, PSH, SH reviewed. Outpatient Encounter Prescriptions as of 09/27/2014  Medication Sig Note  . amLODipine (NORVASC) 5 MG tablet Take 1.5 tablets (7.5 mg total) by mouth daily.   . [DISCONTINUED] amLODipine (NORVASC) 5 MG tablet Take 1 tablet (5 mg total) by mouth daily.   . [DISCONTINUED] Calcium-Vitamin D-Vitamin K (VIACTIV) 998-338-25 MG-UNT-MCG CHEW Chew 1 tablet by mouth daily. 10/19/2013: Hasn't taken in a while   (amlodipine dose prior to today's visit was 44m daily)  No Known Allergies  ROS:  Denies fevers, chills, URI symptoms, cough, shortness of breath, chest pain, palpitations, nausea, vomiting, bowel changes, urinary complaints, edema or other complaints except as noted in HPI  PHYSICAL EXAM: BP 140/88 mmHg  Pulse 80  Ht 5' 9" (1.753 m)  Wt 212 lb (96.163 kg)  BMI 31.29 kg/m2 126/88 on repeat by MD Well developed, pleasant female in no distress Neck: c-spine nontender. No lymphadenopathy, thyromegaly or mass Heart: regular rate and rhythm without murmur Lungs: clear bilaterally Back: no CVA or spinal tenderness Abdomen: soft, nontender, no organomegaly or mass Extremities: nontender at elbows.  Negative phalen and tinel. 2+ pulses Neuro: Normal strength, sensation, DTR's are symmetric. Cranial nerves intact Psych: normal mood, affect, hygiene and grooming Skin:  no rashes  ASSESSMENT/PLAN  Essential hypertension, benign - suboptimally controlled (especially diastolic); low sodium diet, daily exercise, weight loss. increase dose to 7.5 (cut back if low BP's) - Plan: Lipid panel, Comprehensive metabolic panel, amLODipine (NORVASC) 5 MG tablet  Vitamin D deficiency - noncompliant with supplements.  recheck today - Plan: Vit D  25 hydroxy (rtn osteoporosis monitoring)  Anemia, iron deficiency - Plan: CBC with  Differential/Platelet  Weight gain - Plan: TSH   Discussed increasing amlodipine to 7.48m, vs adding HCTZ 12.562m vs not changing medication at this time, and looking closely at decreasing sodium in diet, getting back to a regular exercise routine, and trying to lose the weight that gained, and recheck in 2-3 months, or increase the medication prior to that if BP's remain high.  She plans to increase to the 7.41m53mbut will cut back to 41mg97mse if BP drops to <110/50 or any dizziness.   CBC, c-met, Vit D Lipid (last ate 5 hours ago)  F/u 3 months, sooner prn

## 2014-09-27 NOTE — Patient Instructions (Signed)
Increase the amlodipine to 1.5 tablets every day.  Try and lose the weight that you gained, and exercise at least 30 minutes daily. If your blood pressures is dropping to <110/50 or having frequent dizziness, then decrease the dose back to 5mg .  Continue to monitor your BP at home. Bring your list of BP's and your monitor (for [place] to verify the accuracy of the machine) to your next visit. Low sodium diet is also important in keeping the BP down.,  Low-Sodium Eating Plan Sodium raises blood pressure and causes water to be held in the body. Getting less sodium from food will help lower your blood pressure, reduce any swelling, and protect your heart, liver, and kidneys. We get sodium by adding salt (sodium chloride) to food. Most of our sodium comes from canned, boxed, and frozen foods. Restaurant foods, fast foods, and pizza are also very high in sodium. Even if you take medicine to lower your blood pressure or to reduce fluid in your body, getting less sodium from your food is important. WHAT IS MY PLAN? Most people should limit their sodium intake to 2,300 mg a day. Your health care provider recommends that you limit your sodium intake to __________ a day.  WHAT DO I NEED TO KNOW ABOUT THIS EATING PLAN? For the low-sodium eating plan, you will follow these general guidelines:  Choose foods with a % Daily Value for sodium of less than 5% (as listed on the food label).   Use salt-free seasonings or herbs instead of table salt or sea salt.   Check with your health care provider or pharmacist before using salt substitutes.   Eat fresh foods.  Eat more vegetables and fruits.  Limit canned vegetables. If you do use them, rinse them well to decrease the sodium.   Limit cheese to 1 oz (28 g) per day.   Eat lower-sodium products, often labeled as "lower sodium" or "no salt added."  Avoid foods that contain monosodium glutamate (MSG). MSG is sometimes added to [place] food and some canned  foods.  Check food labels (Nutrition Facts labels) on foods to learn how much sodium is in one serving.  Eat more home-cooked food and less restaurant, buffet, and fast food.  When eating at a restaurant, ask that your food be prepared with less salt or none, if possible.  HOW DO I READ FOOD LABELS FOR SODIUM INFORMATION? The Nutrition Facts label lists the amount of sodium in one serving of the food. If you eat more than one serving, you must multiply the listed amount of sodium by the number of servings. Food labels may also identify foods as:  Sodium free--Less than 5 mg in a serving.  Very low sodium--35 mg or less in a serving.  Low sodium--140 mg or less in a serving.  Light in sodium--50% less sodium in a serving. For example, if a food that usually has 300 mg of sodium is changed to become light in sodium, it will have 150 mg of sodium.  Reduced sodium--25% less sodium in a serving. For example, if a food that usually has 400 mg of sodium is changed to reduced sodium, it will have 300 mg of sodium. WHAT FOODS CAN I EAT? Grains Low-sodium cereals, including oats, puffed wheat and rice, and shredded wheat cereals. Low-sodium crackers. Unsalted rice and pasta. Lower-sodium bread.  Vegetables Frozen or fresh vegetables. Low-sodium or reduced-sodium canned vegetables. Low-sodium or reduced-sodium tomato sauce and paste. Low-sodium or reduced-sodium tomato and vegetable juices.  Fruits Fresh, frozen, and canned fruit. Fruit juice.  Meat and Other Protein Products Low-sodium canned tuna and salmon. Fresh or frozen meat, poultry, seafood, and fish. Lamb. Unsalted nuts. Dried beans, peas, and lentils without added salt. Unsalted canned beans. Homemade soups without salt. Eggs.  Dairy Milk. Soy milk. Ricotta cheese. Low-sodium or reduced-sodium cheeses. Yogurt.  Condiments Fresh and dried herbs and spices. Salt-free seasonings. Onion and garlic powders. Low-sodium varieties of  mustard and ketchup. Lemon juice.  Fats and Oils Reduced-sodium salad dressings. Unsalted butter.  Other Unsalted popcorn and pretzels.  The items listed above may not be a complete list of recommended foods or beverages. Contact your dietitian for more options. WHAT FOODS ARE NOT RECOMMENDED? Grains Instant hot cereals. Bread stuffing, pancake, and biscuit mixes. Croutons. Seasoned rice or pasta mixes. Noodle soup cups. Boxed or frozen macaroni and cheese. Self-rising flour. Regular salted crackers. Vegetables Regular canned vegetables. Regular canned tomato sauce and paste. Regular tomato and vegetable juices. Frozen vegetables in sauces. Salted french fries. Olives. Angie Fava. Relishes. Sauerkraut. Salsa. Meat and Other Protein Products Salted, canned, smoked, spiced, or pickled meats, seafood, or fish. Bacon, ham, sausage, hot dogs, corned beef, chipped beef, and packaged luncheon meats. Salt pork. Jerky. Pickled herring. Anchovies, regular canned tuna, and sardines. Salted nuts. Dairy Processed cheese and cheese spreads. Cheese curds. Blue cheese and cottage cheese. Buttermilk.  Condiments Onion and garlic salt, seasoned salt, table salt, and sea salt. Canned and packaged gravies. Worcestershire sauce. Tartar sauce. Barbecue sauce. Teriyaki sauce. Soy sauce, including reduced sodium. Steak sauce. Fish sauce. Oyster sauce. Cocktail sauce. Horseradish. Regular ketchup and mustard. Meat flavorings and tenderizers. Bouillon cubes. Hot sauce. Tabasco sauce. Marinades. Taco seasonings. Relishes. Fats and Oils Regular salad dressings. Salted butter. Margarine. Ghee. Bacon fat.  Other Potato and tortilla chips. Corn chips and puffs. Salted popcorn and pretzels. Canned or dried soups. Pizza. Frozen entrees and pot pies.  The items listed above may not be a complete list of foods and beverages to avoid. Contact your dietitian for more information. Document Released: 12/01/2001 Document  Revised: 06/16/2013 Document Reviewed: 04/15/2013 [facility] Patient Information 2015 [place], [place]. This information is not intended to replace advice given to you by your health care provider. Make sure you discuss any questions you have with your health care provider.

## 2014-09-28 LAB — TSH: TSH: 2.02 u[IU]/mL (ref 0.350–4.500)

## 2014-09-28 LAB — VITAMIN D 25 HYDROXY (VIT D DEFICIENCY, FRACTURES): Vit D, 25-Hydroxy: 21 ng/mL — ABNORMAL LOW (ref 30–100)

## 2014-09-29 ENCOUNTER — Other Ambulatory Visit: Payer: Self-pay | Admitting: *Deleted

## 2014-09-29 MED ORDER — VITAMIN D (ERGOCALCIFEROL) 1.25 MG (50000 UNIT) PO CAPS: 50000.0000 [IU] | ORAL_CAPSULE | ORAL | Status: DC

## 2015-01-03 ENCOUNTER — Encounter: Payer: BLUE CROSS/BLUE SHIELD | Admitting: Family Medicine

## 2015-04-13 ENCOUNTER — Encounter: Payer: Self-pay | Admitting: Family Medicine

## 2015-04-13 ENCOUNTER — Ambulatory Visit (INDEPENDENT_AMBULATORY_CARE_PROVIDER_SITE_OTHER): Payer: BLUE CROSS/BLUE SHIELD | Admitting: Family Medicine

## 2015-04-13 VITALS — BP 122/86 | HR 84 | Ht 68.5 in | Wt 200.6 lb

## 2015-04-13 DIAGNOSIS — E559 Vitamin D deficiency, unspecified: Secondary | ICD-10-CM

## 2015-04-13 DIAGNOSIS — Z23 Encounter for immunization: Secondary | ICD-10-CM | POA: Diagnosis not present

## 2015-04-13 DIAGNOSIS — J309 Allergic rhinitis, unspecified: Secondary | ICD-10-CM | POA: Diagnosis not present

## 2015-04-13 DIAGNOSIS — I10 Essential (primary) hypertension: Secondary | ICD-10-CM | POA: Diagnosis not present

## 2015-04-13 DIAGNOSIS — Z0000 Encounter for general adult medical examination without abnormal findings: Secondary | ICD-10-CM | POA: Diagnosis not present

## 2015-04-13 LAB — POCT URINALYSIS DIPSTICK
Bilirubin, UA: NEGATIVE
Glucose, UA: NEGATIVE
Ketones, UA: NEGATIVE
NEG CONTROL: NEGATIVE
NITRITE UA: NEGATIVE
PROTEIN UA: NEGATIVE
Spec Grav, UA: >=1.03
UROBILINOGEN UA: NEGATIVE
pH, UA: 5.5

## 2015-04-13 MED ORDER — AMLODIPINE BESYLATE 5 MG PO TABS: 7.5000 mg | ORAL_TABLET | Freq: Every day | ORAL | Status: DC

## 2015-04-13 NOTE — Patient Instructions (Addendum)
  HEALTH MAINTENANCE RECOMMENDATIONS:  It is recommended that you get at least 30 minutes of aerobic exercise at least 5 days/week (for weight loss, you may need as much as 60-90 minutes). This can be any activity that gets your heart rate up. This can be divided in 10-15 minute intervals if needed, but try and build up your endurance at least once a week.  Weight bearing exercise is also recommended twice weekly.  Eat a healthy diet with lots of vegetables, fruits and fiber.  "Colorful" foods have a lot of vitamins (ie green vegetables, tomatoes, red peppers, etc).  Limit sweet tea, regular sodas and alcoholic beverages, all of which has a lot of calories and sugar.  Up to 1 alcoholic drink daily may be beneficial for women (unless trying to lose weight, watch sugars).  Drink a lot of water.  Calcium recommendations are 1200-1500 mg daily (1500 mg for postmenopausal women or women without ovaries), and vitamin D 1000 IU daily.  This should be obtained from diet and/or supplements (vitamins), and calcium should not be taken all at once, but in divided doses.  Monthly self breast exams and yearly mammograms for women over the age of 80 is recommended.  Sunscreen of at least SPF 30 should be used on all sun-exposed parts of the skin when outside between the hours of 10 am and 4 pm (not just when at beach or pool, but even with exercise, golf, tennis, and yard work!)  Use a sunscreen that says "broad spectrum" so it covers both UVA and UVB rays, and make sure to reapply every 1-2 hours.  Remember to change the batteries in your smoke detectors when changing your clock times in the spring and fall.  Use your seat belt every time you are in a car, and please drive safely and not be distracted with cell phones and texting while driving.  Ask your husband about your snoring, and if you take long pauses in your breathing while you are asleep.  Go back to the 7.5mg  of amlodipine daily (1.5 tablets).  Your  blood pressure was perfect at that dose.  The bottom number has gotten slightly borderline since lowering it.  Continue the 7.5mg  daily-- IF/when your blood pressure is consistently <110/60, or if you have any dizziness (lightheadedness), then you can cut back to 5mg  (1 tablet) once daily. If you have concerns and aren't sure, fax or email (myChart) your list of blood pressures for my review and I can give my recommendation.  When keeping record of BP, put in columns for date, morning, evening, "comments"--stressed, headache, forgot pill, ate [place] food (or other high sodium food), etc.  Start taking claritin or allegra or zyrtec once daily--your exam suggests allergies, which is likely causing your sinus headaches. Do not get the "D" versions, just the plain.

## 2015-04-13 NOTE — Progress Notes (Signed)
Chief Complaint  Patient presents with  . Annual Exam    fasting annual exam, no pap-sees Dr.Buist and UTD. No concerns.    Isabella Rodgers is a 44 y.o. female who presents for a complete physical.  She has the following concerns:  Hypertension: She is compliant with taking the amlodipine daily. Her dose was increased 6 months ago to 7.5mg  daily. Three weeks ago she decreased back down to 5mg  because her BP's had been great, and she doesn't like taking medication.  Her BP's had been running 117-125/70's while on the 7.5mg .   Denies any dizziness or other side effects. Since going back to 5mg  of amlodipine, BP's have been 120's/79-83.  She has lost over 10# since her last visit six months ago. She reports that she actually lost 20#, but regained some.  Lost it when eating just vegetables, but then ended up back with some meat, mac and cheese, and craving sweets, honey buns.  She denies dizziness, chest pain, palpitations.No further episodes like prior to her last visit (numb/tingling/high BP at work). Some frontal headaches.  Vitamin D deficiency: Her level was found to be low again at last visit 6 months ago.  She was prescribed a 12 week prescription course.  She has been taking 2000 IU of Vitamin D, but admits to taking it inconsistently, about 3x/week.  H/o Iron deficiency anemia--had improved s/p ablation. She still has periods, they had been light, until this past period.  She had skipped a month, and had a very heavy cycle with cramps this past month. Denies fatigue, weakness, dizziness. Last CBC was normal 6 months ago (see below).  Immunization History  Administered Date(s) Administered  . Influenza, Seasonal, Injecte, Preservative Fre 07/07/2012  . Influenza,inj,Quad PF,36+ Mos 04/15/2014  . Tdap 09/17/2012   Last Pap smear: 08/2013 (prior to ablation), at her GYN. H/o cervical polyps, which were removed. Denies abnormal paps. Last mammogram: she had a baseline.  Reports being past  due to see GYN Last colonoscopy: never Last DEXA: never Dentist: twice yearly Ophtho: yearly Exercise: none lately, other than stairs at work, and parking far away.  Work has been "crazy".  Past Medical History  Diagnosis Date  . Hypertension   . Serum sodium elevated     hx of  . Unspecified vitamin D deficiency   . Iron deficiency anemia     hx--resolved after ablation    Past Surgical History  Procedure Laterality Date  . Tubal ligation    . Cyst excision      left shoulder and left leg  . Endometrial ablation  09/28/2013    Social History   Social History  . Marital Status: Married    Spouse Name: N/A  . Number of Children: 2  . Years of Education: N/A   Occupational History  . [employer]    Social History Main Topics  . Smoking status: Never Smoker   . Smokeless tobacco: Never Used  . Alcohol Use: No  . Drug Use: No  . Sexual Activity:    Partners: Male    Birth Control/ Protection: Surgical   Other Topics Concern  . Not on file   Social History Narrative   Lives with her husband, and youngest daughter [name]); oldest daughter is in college in [place].  Divorced 07/2012; Remarried 01/2013    Family History  Problem Relation Age of Onset  . Diabetes Mother   . Hyperlipidemia Mother   . Hypertension Mother   . Arthritis Mother   .  Diabetes Father   . Hyperlipidemia Father   . Hypertension Father   . Coronary artery disease Maternal Aunt     died at 62  . Diabetes Maternal Grandmother   . Arthritis Maternal Grandmother   . Coronary artery disease Maternal Grandfather     50's  . Diabetes Maternal Grandfather   . Diabetes Paternal Grandmother   . Cancer Paternal Grandfather     prostate  . Diabetes Paternal Grandfather   . Diabetes Maternal Uncle     Outpatient Encounter Prescriptions as of 04/13/2015  Medication Sig Note  . amLODipine (NORVASC) 5 MG tablet Take 1.5 tablets (7.5 mg total) by mouth daily. 04/13/2015: Taking  just 1 tablet daily for the last 3 weeks  . Cholecalciferol (VITAMIN D) 2000 UNITS CAPS Take 1 capsule by mouth daily. 04/13/2015: Takes it about 3 times/week (in the evenings, when she remembers)  . [DISCONTINUED] Vitamin D, Ergocalciferol, (DRISDOL) 50000 UNITS CAPS capsule Take 1 capsule (50,000 Units total) by mouth once a week.    No facility-administered encounter medications on file as of 04/13/2015.    No Known Allergies   ROS: The patient denies anorexia, fever, vision changes, decreased hearing, ear pain, sore throat, breast concerns, chest pain, palpitations, dizziness, syncope, dyspnea on exertion, cough, swelling, nausea, vomiting, diarrhea, constipation, abdominal pain, melena, hematochezia, indigestion/heartburn, hematuria, incontinence, dysuria, irregular menstrual cycles, vaginal discharge, odor or itch, genital lesions, joint pains, numbness, tingling, weakness, tremor, suspicious skin lesions, depression, anxiety, abnormal bleeding/bruising, or enlarged lymph nodes. +weight loss, intentional. Occasional headaches, in the frontal region (which she relates to stress), relieved by ibuprofen. Some snoring, per husband.  Unaware if any apnea.  Doesn't feel tired.  PHYSICAL EXAM:  BP 122/86 mmHg  Pulse 84  Ht 5' 8.5" (1.74 m)  Wt 200 lb 9.6 oz (90.992 kg)  BMI 30.05 kg/m2  LMP 04/13/2015  120/86 on repeat by MD   General Appearance:   Alert, cooperative, no distress, appears stated age  Head:   Normocephalic, without obvious abnormality, atraumatic  Eyes:   PERRL, conjunctiva/corneas clear, EOM's intact, fundi   benign  Ears:   Normal TM's and external ear canals  Nose:  Nares normal; there is moderate edema of nasal mucosa, white crusting bilaterally; no erythema or purulence; no drainage or sinustenderness  Throat:  Lips, mucosa, and tongue normal; teeth and gums normal  Neck:  Supple, no lymphadenopathy; thyroid: no  enlargement/tenderness/nodules; no carotid  bruit or JVD  Back:  Spine nontender, no curvature, ROM normal, no CVA tenderness  Lungs:   Clear to auscultation bilaterally without wheezes, rales or ronchi; respirations unlabored  Chest Wall:   No tenderness or deformity  Heart:   Regular rate and rhythm, S1 and S2 normal, no murmur, rub  or gallop  Breast Exam:   Deferred to GYN  Abdomen:   Soft, non-tender, nondistended, normoactive bowel sounds,   no masses, no hepatosplenomegaly  Genitalia:   Deferred to GYN     Extremities:  No clubbing, cyanosis or edema  Pulses:  2+ and symmetric all extremities  Skin:  Skin color, texture, turgor normal, no rashes or lesions  Lymph nodes:  Cervical, supraclavicular, and axillary nodes normal  Neurologic:  CNII-XII intact, normal strength, sensation and gait; reflexes 2+ and symmetric throughout   Psych: Normal mood, affect, hygiene and grooming        Lab Results  Component Value Date   CHOL 135 09/27/2014   HDL 42* 09/27/2014   LDLCALC 76 09/27/2014  TRIG 86 09/27/2014   CHOLHDL 3.2 09/27/2014     Chemistry      Component Value Date/Time   NA 137 09/27/2014 0001   K 4.7 09/27/2014 0001   CL 105 09/27/2014 0001   CO2 22 09/27/2014 0001   BUN 12 09/27/2014 0001   CREATININE 0.71 09/27/2014 0001      Component Value Date/Time   CALCIUM 8.7 09/27/2014 0001   ALKPHOS 59 09/27/2014 0001   AST 18 09/27/2014 0001   ALT 15 09/27/2014 0001   BILITOT 0.3 09/27/2014 0001     Fasting glucose 77 (09/27/14)  Lab Results  Component Value Date   WBC 8.4 09/27/2014   HGB 13.1 09/27/2014   HCT 39.5 09/27/2014   MCV 84.9 09/27/2014   PLT 261 09/27/2014   Last Vitamin D-OH was 21 (09/27/14)  Lab Results  Component Value Date   TSH 2.020 09/27/2014     ASSESSMENT/PLAN:  Annual physical exam - Plan: Visual acuity screening, POCT Urinalysis  Dipstick  Vitamin D deficiency - Plan: Vit D  25 hydroxy (rtn osteoporosis monitoring)  Essential hypertension, benign - increase amlodipine back to 7.5mg , as BP was at goal. Further weight loss, regular exercise and low sodium diet recommended - Plan: amLODipine (NORVASC) 5 MG tablet  Need for prophylactic vaccination and inoculation against influenza - Plan: Flu Vaccine QUAD 36+ mos PF IM (Fluarix & Fluzone Quad PF)  Allergic rhinitis, unspecified allergic rhinitis type - contributing to frontal headaches.  daily antihistamine recommended   Discussed monthly self breast exams and yearly mammograms after the age of 67; at least 30 minutes of aerobic activity at least 5 days/week, weight bearing exercise 2x/wk; proper sunscreen use reviewed; healthy diet, including goals of calcium and vitamin D intake and alcohol recommendations (less than or equal to 1 drink/day) reviewed; regular seatbelt use; changing batteries in smoke detectors. Immunization recommendations discussed--flu shot given today. Colonoscopy recommendations reviewed, age 29, (sooner prn)  Vitamin D--if still low, given her issues with compliance, consider increasing to 5000 dose (which might be adequate at 3x/wk).  Also discussed changing to taking it in the morning with her BP meds.  Frontal headaches--start claritin

## 2015-04-14 LAB — VITAMIN D 25 HYDROXY (VIT D DEFICIENCY, FRACTURES): Vit D, 25-Hydroxy: 19 ng/mL — ABNORMAL LOW (ref 30–100)

## 2015-04-14 MED ORDER — VITAMIN D (ERGOCALCIFEROL) 1.25 MG (50000 UNIT) PO CAPS: 50000.0000 [IU] | ORAL_CAPSULE | ORAL | Status: DC

## 2015-04-14 NOTE — Addendum Note (Signed)
Addended by: Rita Ohara on: 04/14/2015 08:35 AM   Modules accepted: Orders

## 2015-11-23 ENCOUNTER — Ambulatory Visit (INDEPENDENT_AMBULATORY_CARE_PROVIDER_SITE_OTHER): Payer: BLUE CROSS/BLUE SHIELD | Admitting: Family Medicine

## 2015-11-23 ENCOUNTER — Encounter: Payer: Self-pay | Admitting: Family Medicine

## 2015-11-23 VITALS — BP 120/86 | HR 72 | Ht 69.0 in | Wt 204.4 lb

## 2015-11-23 DIAGNOSIS — E559 Vitamin D deficiency, unspecified: Secondary | ICD-10-CM | POA: Diagnosis not present

## 2015-11-23 DIAGNOSIS — I10 Essential (primary) hypertension: Secondary | ICD-10-CM

## 2015-11-23 MED ORDER — AMLODIPINE BESYLATE 5 MG PO TABS: 7.5000 mg | ORAL_TABLET | Freq: Every day | ORAL | Status: DC

## 2015-11-23 NOTE — Patient Instructions (Addendum)
Continue the 7.5mg  dose of amlodipine--your blood pressure is very well controlled. We will contact you tomorrow about your vitamin D level.  If it is again low we will send another prescription. If it is only borderline low, then you will be advised to restart the 2000 IU daily of over-the-counter Vitamin D. It is very important that you continue to take the Vitamin D daily, long-term, to help protect your bones.  Try and increase your exercise to at least 30 minutes daily (minimum of 150 minutes/week)

## 2015-11-23 NOTE — Progress Notes (Signed)
Chief Complaint  Patient presents with  . Hypertension    fasting med check. No concerns.    Hypertension: She is compliant with taking the amlodipine daily. She continues on the 7.5mg  dose.   Her BP's had been running 120/80's. Denies any dizziness, headaches, edema (only if eats salty foods), chest pain, palpitations. Denies side effects to medications.  She walks the dog daily, about 15 minutes.   Vitamin D deficiency: Her level was found to be low again at last visit 6 months ago (level was 19 in 03/2015). She was prescribed a 12 week prescription course.She admits that she forgot to restart the Vitamin D 2000 IU after finishing the prescription course.  She has been getting some sun exposure.  H/o Iron deficiency anemia--had improved s/p ablation. She still has periods, they are back to being normal, not heavy and no cramping.  Denies dizziness, fatigue.  PMH, PSH, SH reviewed.  Med prior to visit:  Amlodipine 5mg , 1.5mg  tablet daily No Known Allergies  ROS: no headaches, dizziness, chest pain ,palpitations, edema, fever, chills, URI symptoms, cough, shortness of breath, nausea, vomiting, bowel changes, bleeding, bruising, rash, fatigue, joint pains or other concerns.  PHYSICAL EXAM: BP 120/86 mmHg  Pulse 72  Ht 5\' 9"  (1.753 m)  Wt 204 lb 6.4 oz (92.715 kg)  BMI 30.17 kg/m2  LMP 11/05/2015  Well developed, pleasant female in no distress Neck: no lymphadenopathy, thyromegaly or mass, no bruit Heart: regular rate and rhythm without murmur Lungs: clear bilaterally Abdomen: soft, nontender, no organomegaly or mass Extremities: no edema, 2+ pulses Psych: normal mood, affect, hygiene and grooming Neuro: alert and oriented. Cranial nerves intact, normal strength, sensation, gait.  ASSESSMENT/PLAN:  Essential hypertension, benign - well controlled; continue amlodipine; increase exercise to 30 mins daily - Plan: amLODipine (NORVASC) 5 MG tablet  Vitamin D deficiency -  suspect it will be low again due to noncompliance with daily supplement. After completion of any new rx, resume 2000 IU daily, and take longterm - Plan: VITAMIN D 25 Hydroxy (Vit-D Deficiency, Fractures)    [facility] D refill needed   F/u 6 months CPE. If vitamin D levels can get corrected, she likely can go to once yearly visits (BP well controlled).

## 2015-11-24 LAB — VITAMIN D 25 HYDROXY (VIT D DEFICIENCY, FRACTURES): VIT D 25 HYDROXY: 24 ng/mL — AB (ref 30–100)

## 2015-12-20 ENCOUNTER — Emergency Department (HOSPITAL_COMMUNITY): Payer: BLUE CROSS/BLUE SHIELD

## 2015-12-20 ENCOUNTER — Encounter (HOSPITAL_COMMUNITY): Payer: Self-pay | Admitting: Adult Health

## 2015-12-20 DIAGNOSIS — I10 Essential (primary) hypertension: Secondary | ICD-10-CM | POA: Diagnosis not present

## 2015-12-20 DIAGNOSIS — R0789 Other chest pain: Secondary | ICD-10-CM | POA: Insufficient documentation

## 2015-12-20 DIAGNOSIS — R072 Precordial pain: Secondary | ICD-10-CM | POA: Diagnosis present

## 2015-12-20 LAB — BASIC METABOLIC PANEL
ANION GAP: 4 — AB (ref 5–15)
BUN: 15 mg/dL (ref 6–20)
CALCIUM: 8.7 mg/dL — AB (ref 8.9–10.3)
CHLORIDE: 108 mmol/L (ref 101–111)
CO2: 24 mmol/L (ref 22–32)
CREATININE: 0.89 mg/dL (ref 0.44–1.00)
GFR calc non Af Amer: >60 mL/min (ref >60)
Glucose, Bld: 97 mg/dL (ref 65–99)
Potassium: 3.5 mmol/L (ref 3.5–5.1)
SODIUM: 136 mmol/L (ref 135–145)

## 2015-12-20 LAB — CBC
HCT: 38.5 % (ref 36.0–46.0)
HEMOGLOBIN: 12.3 g/dL (ref 12.0–15.0)
MCH: 27.6 pg (ref 26.0–34.0)
MCHC: 31.9 g/dL (ref 30.0–36.0)
MCV: 86.3 fL (ref 78.0–100.0)
PLATELETS: 242 10*3/uL (ref 150–400)
RBC: 4.46 MIL/uL (ref 3.87–5.11)
RDW: 12.6 % (ref 11.5–15.5)
WBC: 5.9 10*3/uL (ref 4.0–10.5)

## 2015-12-20 LAB — I-STAT TROPONIN, ED: TROPONIN I, POC: 0.01 ng/mL (ref 0.00–0.08)

## 2015-12-20 NOTE — ED Notes (Signed)
Presents with sternal chest pain described as "bricks on my chest and stabbing pains at times" began at 7 pm while sitting down and braiding her daughters hair. Pain became less when lying down. Denies radiation of pain, denies SOB, nausea, vomting and diaphoresis. Rates pain 4/10 at this time. BP 144/105.

## 2015-12-21 ENCOUNTER — Emergency Department (HOSPITAL_COMMUNITY)
Admission: EM | Admit: 2015-12-21 | Discharge: 2015-12-21 | Disposition: A | Discharge status: Home / Self Care | Payer: BLUE CROSS/BLUE SHIELD | Attending: Emergency Medicine | Admitting: Emergency Medicine

## 2015-12-21 DIAGNOSIS — R079 Chest pain, unspecified: Secondary | ICD-10-CM

## 2015-12-21 LAB — I-STAT TROPONIN, ED: Troponin i, poc: 0 ng/mL (ref 0.00–0.08)

## 2015-12-21 MED ORDER — INSULIN ASPART 100 UNIT/ML ~~LOC~~ SOLN: 4.0000 [IU] | Freq: Once | SUBCUTANEOUS | Status: DC

## 2015-12-21 NOTE — Discharge Instructions (Signed)
Tylenol and Motrin for pain.  Follow-up with your primary care doctor.  Return here as needed.  Your testing here tonight did not show any abnormalities

## 2015-12-21 NOTE — ED Provider Notes (Signed)
CSN: [id number]     Arrival date & time 12/20/15  2013 History  By signing my name below, I, Isabella Rodgers, attest that this documentation has been prepared under the direction and in the presence of [employer], PA-C.  Electronically Signed: Julien Rodgers, ED Scribe. 12/21/2015. 12:54 AM.     Chief Complaint  Patient presents with  . Chest Pain      The history is provided by the patient. No language interpreter was used.   HPI Comments: Isabella Rodgers is a 45 y.o. female who has a PMhx of HTN presents to the Emergency Department complaining of sudden onset, intermittent, gradual worsening, moderate non-radiating substernal chest soreness onset ~ 6 hours ago. She reports associated shortness of breath that she notes feels like she is "gasping" for air when the pain is present. Pt says that she was braiding her daughters hair when the pain first started and laid down after which alleviated her pain. Pt notes each episode lasted about 2 minutes and says it felt like she was having contractions. She says each episode came about every 10 minutes. Pt has not had any heart problems in the past. She denies abdominal pain, nausea, or diaphoresis.   Past Medical History  Diagnosis Date  . Hypertension   . Serum sodium elevated     hx of  . Unspecified vitamin D deficiency   . Iron deficiency anemia     hx--resolved after ablation   Past Surgical History  Procedure Laterality Date  . Tubal ligation    . Cyst excision      left shoulder and left leg  . Endometrial ablation  09/28/2013   Family History  Problem Relation Age of Onset  . Diabetes Mother   . Hyperlipidemia Mother   . Hypertension Mother   . Arthritis Mother   . Diabetes Father   . Hyperlipidemia Father   . Hypertension Father   . Coronary artery disease Maternal Aunt     died at 61  . Diabetes Maternal Grandmother   . Arthritis Maternal Grandmother   . Coronary artery disease Maternal Grandfather     50's  .  Diabetes Maternal Grandfather   . Diabetes Paternal Grandmother   . Cancer Paternal Grandfather     prostate  . Diabetes Paternal Grandfather   . Diabetes Maternal Uncle    Social History  Substance Use Topics  . Smoking status: Never Smoker   . Smokeless tobacco: Never Used  . Alcohol Use: No   OB History    Gravida Para Term Preterm AB TAB SAB Ectopic Multiple Living   4 2   2 2    2      Review of Systems  A complete 10 system review of systems was obtained and all systems are negative except as noted in the HPI and PMH.    Allergies  Review of patient's allergies indicates no known allergies.  Home Medications   Prior to Admission medications   Medication Sig Start Date End Date Taking? Authorizing Provider  amLODipine (NORVASC) 5 MG tablet Take 1.5 tablets (7.5 mg total) by mouth daily. 11/23/15   Rita Ohara, MD  Cholecalciferol (VITAMIN D) 2000 UNITS CAPS Take 1 capsule by mouth daily. Reported on 11/23/2015    Historical Provider, MD   BP 131/88 mmHg  Pulse 70  Temp(Src) 98.6 F (37 C) (Oral)  Resp 14  Wt 206 lb (93.441 kg)  SpO2 100%  LMP 11/28/2015 (Approximate) Physical Exam  Constitutional: She is  oriented to person, place, and time. She appears well-developed and well-nourished. No distress.  HENT:  Head: Normocephalic and atraumatic.  Mouth/Throat: Oropharynx is clear and moist.  Eyes: EOM are normal. Pupils are equal, round, and reactive to light.  Neck: Normal range of motion. Neck supple.  Cardiovascular: Normal rate, regular rhythm and normal heart sounds.  Exam reveals no gallop and no friction rub.   No murmur heard. Pulmonary/Chest: Effort normal and breath sounds normal. No respiratory distress.  Abdominal: Soft. She exhibits no distension. There is no tenderness.  Musculoskeletal: Normal range of motion.  Neurological: She is alert and oriented to person, place, and time. She exhibits normal muscle tone. Coordination normal.  Skin: Skin is warm  and dry.  Psychiatric: She has a normal mood and affect. Judgment normal.  Nursing note and vitals reviewed.   ED Course  Procedures  DIAGNOSTIC STUDIES: Oxygen Saturation is 100% on RA, normal by my interpretation.  COORDINATION OF CARE:  12:50 AM Repeat heart enzymes. Discussed treatment plan with pt at bedside and pt agreed to plan.  Labs Review Labs Reviewed  BASIC METABOLIC PANEL - Abnormal; Notable for the following:    Calcium 8.7 (*)    Anion gap 4 (*)    All other components within normal limits  CBC  I-STAT TROPOININ, ED    Imaging Review Dg Chest 2 View  12/20/2015  CLINICAL DATA:  Chest pain EXAM: CHEST  2 VIEW COMPARISON:  None. FINDINGS: The heart size and mediastinal contours are within normal limits. Both lungs are clear. The visualized skeletal structures are unremarkable. IMPRESSION: No active cardiopulmonary disease. Electronically Signed   By: Dorise Bullion III M.D   On: 12/20/2015 21:48   I have personally reviewed and evaluated these images and lab results as part of my medical decision-making.   EKG Interpretation   Date/Time:  Tuesday December 20 2015 20:18:21 EDT Ventricular Rate:  78 PR Interval:  142 QRS Duration: 82 QT Interval:  376 QTC Calculation: 428 R Axis:   47 Text Interpretation:  Normal sinus rhythm Normal ECG No old tracing to  compare Confirmed by Glynn Octave [id number]) on 12/21/2015 2:46:45 AM         MDM   Final diagnoses:  None   The patient's chest pain seems atypical for cardiac disease based on the fact that it is very short in nature and started when she was braiding her daughters hair.  She is having pain at this time patient will be referred back to her primary care Dr. for further evaluation.  She has 2 sets of negative troponins.  Her chest x-ray does not show any abnormalities.  I personally performed the services described in this documentation, which was scribed in my presence. The recorded information has  been reviewed and is accurate.   Dalia Heading, PA-C 12/21/15 [id number]  Everlene Balls, MD 12/21/15 [phone]

## 2016-02-08 ENCOUNTER — Telehealth: Payer: Self-pay | Admitting: Family Medicine

## 2016-02-08 NOTE — Telephone Encounter (Signed)
Pt husband dropped off a health examination form to be filled out,for Winry put in your folder, pt can be reached at [phone] (M)

## 2016-02-08 NOTE — Telephone Encounter (Signed)
This form requires a PPD (TB skin test).  She hasn't had one here.  Needs to have PPD for form to be completed. Please advise

## 2016-02-09 ENCOUNTER — Telehealth: Payer: Self-pay

## 2016-02-09 NOTE — Telephone Encounter (Signed)
Called and left message to call us back

## 2016-02-09 NOTE — Telephone Encounter (Signed)
Pt request a callback in regards to paperwork. Said you left a VCM that it was ready, but I did not find it in the basket.

## 2016-02-10 ENCOUNTER — Other Ambulatory Visit (INDEPENDENT_AMBULATORY_CARE_PROVIDER_SITE_OTHER): Payer: BLUE CROSS/BLUE SHIELD

## 2016-02-10 DIAGNOSIS — Z111 Encounter for screening for respiratory tuberculosis: Secondary | ICD-10-CM

## 2016-02-13 LAB — TB SKIN TEST: TB SKIN TEST: NEGATIVE

## 2016-02-20 ENCOUNTER — Ambulatory Visit: Payer: BLUE CROSS/BLUE SHIELD | Admitting: Family Medicine

## 2016-02-23 ENCOUNTER — Telehealth: Payer: Self-pay | Admitting: Family Medicine

## 2016-02-23 NOTE — Telephone Encounter (Signed)
This might be appropriate for her to try the e-visits--check on this please. In general, if from PND from allergies, should be taking antihistamine (zyrtec, allegra or claritin), as well as mucinex to thin out the mucus that may be draining.  Can use the DM form, of mucinex, vs the PLAIN, along with delsym as needed.  If from wheezing, this is something we would need to listen for, to see if inhaler would be appropriate. She has no h/o asthma, so not likely, likely more allergic.  If ongoing cough, would need eval for ENT and lung exam--if can't come here, can go to an urgent care.

## 2016-02-23 NOTE — Telephone Encounter (Signed)
Pt says she has a constant dry cough, muscle aches, headache & now the cough is causing a sore throat. Pt has been taking Delsym & Motrim. 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 Pt is not able to get off work to get here in time for an appointment. She is requesting a call to discuss what she can do about this.

## 2016-02-23 NOTE — Telephone Encounter (Signed)
Left message for patient to return my call.

## 2016-02-23 NOTE — Telephone Encounter (Signed)
Informed pt word for word she verbalized understanding

## 2016-02-27 ENCOUNTER — Emergency Department (HOSPITAL_COMMUNITY): Payer: BLUE CROSS/BLUE SHIELD

## 2016-02-27 ENCOUNTER — Emergency Department (HOSPITAL_COMMUNITY)
Admission: EM | Admit: 2016-02-27 | Discharge: 2016-02-27 | Disposition: A | Discharge status: Home / Self Care | Payer: BLUE CROSS/BLUE SHIELD | Attending: Emergency Medicine | Admitting: Emergency Medicine

## 2016-02-27 ENCOUNTER — Encounter (HOSPITAL_COMMUNITY): Payer: Self-pay | Admitting: Vascular Surgery

## 2016-02-27 DIAGNOSIS — J189 Pneumonia, unspecified organism: Secondary | ICD-10-CM | POA: Insufficient documentation

## 2016-02-27 DIAGNOSIS — Z79899 Other long term (current) drug therapy: Secondary | ICD-10-CM | POA: Insufficient documentation

## 2016-02-27 DIAGNOSIS — I10 Essential (primary) hypertension: Secondary | ICD-10-CM | POA: Insufficient documentation

## 2016-02-27 LAB — BASIC METABOLIC PANEL
Anion gap: 7 (ref 5–15)
BUN: 10 mg/dL (ref 6–20)
CHLORIDE: 104 mmol/L (ref 101–111)
CO2: 22 mmol/L (ref 22–32)
Calcium: 8.8 mg/dL — ABNORMAL LOW (ref 8.9–10.3)
Creatinine, Ser: 0.93 mg/dL (ref 0.44–1.00)
GFR calc Af Amer: >60 mL/min (ref >60)
GFR calc non Af Amer: >60 mL/min (ref >60)
Glucose, Bld: 91 mg/dL (ref 65–99)
POTASSIUM: 4.1 mmol/L (ref 3.5–5.1)
SODIUM: 133 mmol/L — AB (ref 135–145)

## 2016-02-27 LAB — CBC
HEMATOCRIT: 38 % (ref 36.0–46.0)
Hemoglobin: 12.6 g/dL (ref 12.0–15.0)
MCH: 28.1 pg (ref 26.0–34.0)
MCHC: 33.2 g/dL (ref 30.0–36.0)
MCV: 84.8 fL (ref 78.0–100.0)
Platelets: 420 10*3/uL — ABNORMAL HIGH (ref 150–400)
RBC: 4.48 MIL/uL (ref 3.87–5.11)
RDW: 12.8 % (ref 11.5–15.5)
WBC: 8.6 10*3/uL (ref 4.0–10.5)

## 2016-02-27 MED ORDER — DEXTROSE 5 % IV SOLN
1.0000 g | Freq: Once | INTRAVENOUS | Status: AC
  Administered 2016-02-27: 1 g via INTRAVENOUS
  Filled 2016-02-27: qty 10

## 2016-02-27 MED ORDER — AZITHROMYCIN 250 MG PO TABS: 250.0000 mg | ORAL_TABLET | Freq: Every day | ORAL | 0 refills | Status: DC

## 2016-02-27 MED ORDER — HYDROCODONE-HOMATROPINE 5-1.5 MG/5ML PO SYRP: 5.0000 mL | ORAL_SOLUTION | Freq: Four times a day (QID) | ORAL | 0 refills | Status: DC | PRN

## 2016-02-27 MED ORDER — CEPHALEXIN 500 MG PO CAPS: 500.0000 mg | ORAL_CAPSULE | Freq: Four times a day (QID) | ORAL | 0 refills | Status: DC

## 2016-02-27 MED ORDER — DEXTROSE 5 % IV SOLN
500.0000 mg | Freq: Once | INTRAVENOUS | Status: AC
  Administered 2016-02-27: 500 mg via INTRAVENOUS
  Filled 2016-02-27: qty 500

## 2016-02-27 NOTE — ED Provider Notes (Signed)
[facility] DEPT Provider Note   CSN: [id number] Arrival date & time: 02/27/16  1639     History   Chief Complaint Chief Complaint  Patient presents with  . Cough    HPI Isabella Rodgers is a 45 y.o. female.  Patient presents to the ED with a chief complaint of cough x 2 weeks.  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  She states that she was seen at an urgent care and was treated with bactrim and tessalon for bronchitis.  She states that her symptoms have persisted and she is now having a fever.  She is otherwise healthy, does not smoke or have COPD or asthma.  There are no modifying factors.   The history is provided by the patient. No language interpreter was used.    Past Medical History:  Diagnosis Date  . Hypertension   . Iron deficiency anemia    hx--resolved after ablation  . Serum sodium elevated    hx of  . Unspecified vitamin D deficiency     Patient Active Problem List   Diagnosis Date Noted  . Essential hypertension, benign 07/07/2012  . Vitamin D deficiency 07/07/2012    Past Surgical History:  Procedure Laterality Date  . CYST EXCISION     left shoulder and left leg  . ENDOMETRIAL ABLATION  09/28/2013  . TUBAL LIGATION      OB History    Gravida Para Term Preterm AB Living   4 2     2 2    SAB TAB Ectopic Multiple Live Births     2             Home Medications    Prior to Admission medications   Medication Sig Start Date End Date Taking? Authorizing Provider  amLODipine (NORVASC) 5 MG tablet Take 1.5 tablets (7.5 mg total) by mouth daily. 11/23/15   Rita Ohara, MD  Cholecalciferol (VITAMIN D) 2000 UNITS CAPS Take 1 capsule by mouth daily. Reported on 11/23/2015    Historical Provider, MD    Family History Family History  Problem Relation Age of Onset  . Diabetes Mother   . Hyperlipidemia Mother   . Hypertension Mother   . Arthritis Mother   . Diabetes Father   . Hyperlipidemia Father     . Hypertension Father   . Coronary artery disease Maternal Aunt     died at 2  . Diabetes Maternal Grandmother   . Arthritis Maternal Grandmother   . Coronary artery disease Maternal Grandfather     50's  . Diabetes Maternal Grandfather   . Diabetes Paternal Grandmother   . Cancer Paternal Grandfather     prostate  . Diabetes Paternal Grandfather   . Diabetes Maternal Uncle     Social History Social History  Substance Use Topics  . Smoking status: Never Smoker  . Smokeless tobacco: Never Used  . Alcohol use No     Allergies   Review of patient's allergies indicates no known allergies.   Review of Systems Review of Systems  Constitutional: Positive for fever.  Respiratory: Positive for cough.   All other systems reviewed and are negative.    Physical Exam Updated Vital Signs BP 131/82   Pulse 95   Temp 100 F (37.8 C) (Oral)   Resp 13   SpO2 100%   Physical Exam  Constitutional: She is oriented to person, place, and time. She appears well-developed and well-nourished.  HENT:  Head:  Normocephalic and atraumatic.  Eyes: Conjunctivae and EOM are normal. Pupils are equal, round, and reactive to light.  Neck: Normal range of motion. Neck supple.  Cardiovascular: Normal rate and regular rhythm.  Exam reveals no gallop and no friction rub.   No murmur heard. Pulmonary/Chest: Effort normal and breath sounds normal. No respiratory distress. She has no wheezes. She has no rales. She exhibits no tenderness.  Abdominal: Soft. Bowel sounds are normal. She exhibits no distension and no mass. There is no tenderness. There is no rebound and no guarding.  Musculoskeletal: Normal range of motion. She exhibits no edema or tenderness.  Neurological: She is alert and oriented to person, place, and time.  Skin: Skin is warm and dry.  Psychiatric: She has a normal mood and affect. Her behavior is normal. Judgment and thought content normal.  Nursing note and vitals  reviewed.    ED Treatments / Results  Labs (all labs ordered are listed, but only abnormal results are displayed) Labs Reviewed  BASIC METABOLIC PANEL - Abnormal; Notable for the following:       Result Value   Sodium 133 (*)    Calcium 8.8 (*)    All other components within normal limits  CBC - Abnormal; Notable for the following:    Platelets 420 (*)    All other components within normal limits    EKG  EKG Interpretation None       Radiology Dg Chest 2 View  Result Date: 02/27/2016 CLINICAL DATA:  Cough and shortness of breath for 2 weeks. EXAM: CHEST  2 VIEW COMPARISON:  December 20, 2015 FINDINGS: There is an infiltrate in the left perihilar region, noted to be in the left upper lobe on the lateral view, not present on December 20, 2015. No pulmonary nodules or masses. No other interval changes or acute abnormalities are identified. IMPRESSION: There is an infiltrate in the left upper lobe not present on December 20, 2015. The findings are likely infectious given history. Recommend treatment with short-term follow-up to ensure resolution. Electronically Signed   By: Dorise Bullion III M.D   On: 02/27/2016 17:55    Procedures Procedures (including critical care time)  Medications Ordered in ED Medications  azithromycin (ZITHROMAX) 500 mg in dextrose 5 % 250 mL IVPB (not administered)  cefTRIAXone (ROCEPHIN) 1 g in dextrose 5 % 50 mL IVPB (not administered)     Initial Impression / Assessment and Plan / ED Course  I have reviewed the triage vital signs and the nursing notes.  Pertinent labs & imaging results that were available during my care of the patient were reviewed by me and considered in my medical decision making (see chart for details).  Clinical Course    Patient with cough and fever.  CXR consistent with focal infiltrate in left upper lobe.  Will cover with rocephin and azithro in the ED and DC on azithro and keflex.     Final Clinical Impressions(s) / ED  Diagnoses   Final diagnoses:  CAP (community acquired pneumonia)    New Prescriptions Discharge Medication List as of 02/27/2016  7:56 PM    START taking these medications   Details  azithromycin (ZITHROMAX) 250 MG tablet Take 1 tablet (250 mg total) by mouth daily. Take first 2 tablets together, then 1 every day until finished., Starting Mon 02/27/2016, Print    cephALEXin (KEFLEX) 500 MG capsule Take 1 capsule (500 mg total) by mouth 4 (four) times daily., Starting Mon 02/27/2016, Print  HYDROcodone-homatropine (HYCODAN) 5-1.5 MG/5ML syrup Take 5 mLs by mouth every 6 (six) hours as needed for cough., Starting Mon 02/27/2016, Print         Montine Circle, PA-C 02/27/16 2132    Margette Fast, MD 02/28/16 [phone]

## 2016-02-27 NOTE — ED Triage Notes (Signed)
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 Pt reports she took Delsym and Motrin with minimal relief. Went to [facility] and was given Proair inhaler, SMZ/TMP, and tessalon pearles. These medications also did not relieve the symptoms. Pt reports it is a dry cough with an occasional blood tinged mucous.

## 2016-02-27 NOTE — Discharge Instructions (Signed)
Stop taking medication prescribed by urgent care.

## 2016-03-07 ENCOUNTER — Telehealth: Payer: Self-pay | Admitting: *Deleted

## 2016-03-07 NOTE — Telephone Encounter (Signed)
error 

## 2016-03-07 NOTE — Telephone Encounter (Signed)
Patient called and was trying to get an ER follow up yesterday-was seen and diagnosed with pneumonia. 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 She needs a 4:15pm appt and you do not see anyone at that time. She called to see if you would either make an exception or if she should see another provider in the practice? She also asked if maybe she should just transfer to another office as thus may pose a potential problem with her new job and the timing and not being able to get out before 4:00pm-please advise.

## 2016-03-07 NOTE — Telephone Encounter (Signed)
I'm fine with her seeing another provider in this office who may be able to see her at a later time--they will still have all of her records, and be able to consult me with any concerns.  However, she did no-show a late appointment with another provider in this office already--she needs to be aware of no show policy.  I have lots of teachers in my practice, who utilize the holidays and breaks for routine care, and also are able to come to the office when sick, or utilize urgent care of acute illness after hours.  If she doesn't feel this office will work for her, of course she can switch if there is another office with more convenient hours.  But we manage to see a LOT of teachers within our practice.

## 2016-03-07 NOTE — Telephone Encounter (Signed)
Left message for patient advising her of Dr.Knapp's response. Also let her know she could call me today after 4:00pm, when she gets out of work if she would like to discuss further.

## 2016-03-12 NOTE — Progress Notes (Signed)
Chief Complaint  Patient presents with  . Follow-up    ER follow up from pneumonia visit.    Patient was seen in the ER on 9/4 and diagnosed with community acquired pneumonia.  She presented with cough and fever.  CXR consistent with focal infiltrate in left upper lobe. She was treated with rocephin and azithro in the ED and sent out on azithro and keflex.   [facility], dx'd with bronchitis, can't recall which ABX she was prescribed; didn't get better, so went to the ER. She was also given albuterol (respiclick, she believes).  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  She continues to have coughing fits in the mornings. Mucus is sticky, yellow (very pale)--more in the morning, but gets some phlegm up throughout the day also (same color).  No further fever.  She [facility] an inhaler from  [facility], which she has used in the middle of the night if she wakes up coughing--able to get back to sleep.  Denies shortness of breath during the day or with any exertion. Denies sniffling, sneezing, sinus pressure, sore throat, ear pain.  Using Hall's cough drops, no other OTC medications.  PMH, PSH, SH reviewed and updated.  Outpatient Encounter Prescriptions as of 03/13/2016  Medication Sig  . amLODipine (NORVASC) 5 MG tablet Take 1.5 tablets (7.5 mg total) by mouth daily.  . Cholecalciferol (VITAMIN D) 2000 UNITS CAPS Take 1 capsule by mouth daily. Reported on 11/23/2015  . [DISCONTINUED] azithromycin (ZITHROMAX) 250 MG tablet Take 1 tablet (250 mg total) by mouth daily. Take first 2 tablets together, then 1 every day until finished.  . [DISCONTINUED] cephALEXin (KEFLEX) 500 MG capsule Take 1 capsule (500 mg total) by mouth 4 (four) times daily.  . [DISCONTINUED] HYDROcodone-homatropine (HYCODAN) 5-1.5 MG/5ML syrup Take 5 mLs by mouth every 6 (six) hours as needed for cough.   No facility-administered encounter medications on file as of 03/13/2016.    Denies fever,  chills, nausea, vomiting, diarrhea (had some initially from ABX).  Denies vaginal discharge, itching. Denies bleeding, bruising, rash.  PHYSICAL EXAM: BP 120/80 (BP Location: Right Arm, Patient Position: Sitting, Cuff Size: Normal)   Pulse 84   Temp 99 F (37.2 C) (Tympanic)   Ht 5' 8.5" (1.74 m)   Wt 207 lb 6.4 oz (94.1 kg)   LMP 02/21/2016 (Approximate)   BMI 31.08 kg/m    Well appearing female in no distress. Periodic dry cough throughout the visit, especially after deep breaths. HEENT: PERRL, EOMI, conjunctiva and sclera clear TM's and EAC's normal. Nasal mucosa is moderately edematous, pale/blue, no erythema or purulence OP is clear; sinuses nontender Neck: no lymphadenopathy or mass Heart; regular rate and rhythm, no murmur Lungs: clear bilaterally. No wheezes, rales, ronchi Extremities: no edema Skin: normal turgor, no rash Neuro: alert and oriented, cranial nerves intact, normal strength, gait  Peak flow 400/410/400  ASSESSMENT/PLAN:  Community acquired pneumonia - persistent cough and low grade fever. Recheck CXR - Plan: DG Chest 2 View  Cough - Mucinex DM, albuterol prn; await CXR results.  consider prednisone if these measures aren't working ,and PNA resolving. - Plan: DG Chest 2 View '  I recommend taking Mucinex DM. This has expectorant to thin out any thick phlegm, as well as a cough suppressant. Use the albuterol inhaler that you have as needed for spells of coughing. 2 puffs every 4-6 hours as needed.   Go to [facility] ([id number] [place])  to get a follow-up chest x-ray. You might want to call and verify the latest time they take walk-ins.  Appointments aren't needed for chest x-rays.  We will contact you with those results. We will also see how the cough is doing with the albuterol and Mucinex D. Please monitor your temperature--you had a low grade fever today (99)--I would want to know if you have ongoing fevers.  Plans to get flu shot at  school.

## 2016-03-13 ENCOUNTER — Encounter: Payer: Self-pay | Admitting: Family Medicine

## 2016-03-13 ENCOUNTER — Ambulatory Visit (INDEPENDENT_AMBULATORY_CARE_PROVIDER_SITE_OTHER): Payer: BC Managed Care – PPO | Admitting: Family Medicine

## 2016-03-13 VITALS — BP 120/80 | HR 84 | Temp 99.0°F | Ht 68.5 in | Wt 207.4 lb

## 2016-03-13 DIAGNOSIS — R059 Cough, unspecified: Secondary | ICD-10-CM

## 2016-03-13 DIAGNOSIS — R05 Cough: Secondary | ICD-10-CM

## 2016-03-13 DIAGNOSIS — J189 Pneumonia, unspecified organism: Secondary | ICD-10-CM

## 2016-03-13 NOTE — Patient Instructions (Signed)
   I recommend taking Mucinex DM. This has expectorant to thin out any thick phlegm, as well as a cough suppressant. Use the albuterol inhaler that you have as needed for spells of coughing. 2 puffs every 4-6 hours as needed.   Go to [facility] ([email or web address] or [place]) to get a follow-up chest x-ray. You might want to call and verify the latest time they take walk-ins.  Appointments aren't needed for chest x-rays.  We will contact you with those results. We will also see how the cough is doing with the albuterol and Mucinex D. Please monitor your temperature--you had a low grade fever today (99)--I would want to know if you have ongoing fevers.

## 2016-03-28 ENCOUNTER — Telehealth: Payer: Self-pay | Admitting: Family Medicine

## 2016-03-28 ENCOUNTER — Ambulatory Visit
Admission: RE | Admit: 2016-03-28 | Discharge: 2016-03-28 | Disposition: A | Discharge status: Home / Self Care | Payer: BLUE CROSS/BLUE SHIELD | Source: Ambulatory Visit | Attending: Family Medicine | Admitting: Family Medicine

## 2016-03-28 DIAGNOSIS — J189 Pneumonia, unspecified organism: Secondary | ICD-10-CM

## 2016-03-28 DIAGNOSIS — R059 Cough, unspecified: Secondary | ICD-10-CM

## 2016-03-28 DIAGNOSIS — R05 Cough: Secondary | ICD-10-CM

## 2016-03-28 NOTE — Telephone Encounter (Signed)
Pt came in and stated that her mail order pharmacy has changed. She now uses [facility]. Please change rx's to new pharmacy.

## 2016-03-29 ENCOUNTER — Other Ambulatory Visit: Payer: Self-pay | Admitting: *Deleted

## 2016-03-29 DIAGNOSIS — I10 Essential (primary) hypertension: Secondary | ICD-10-CM

## 2016-03-29 MED ORDER — AMLODIPINE BESYLATE 5 MG PO TABS: 7.5000 mg | ORAL_TABLET | Freq: Every day | ORAL | 0 refills | Status: DC

## 2016-03-29 MED ORDER — LEVOFLOXACIN 750 MG PO TABS: 750.0000 mg | ORAL_TABLET | Freq: Every day | ORAL | 0 refills | Status: DC

## 2016-04-02 ENCOUNTER — Other Ambulatory Visit: Payer: Self-pay | Admitting: *Deleted

## 2016-04-02 DIAGNOSIS — R05 Cough: Secondary | ICD-10-CM

## 2016-04-02 DIAGNOSIS — R059 Cough, unspecified: Secondary | ICD-10-CM

## 2016-06-11 ENCOUNTER — Encounter: Payer: BLUE CROSS/BLUE SHIELD | Admitting: Family Medicine

## 2016-06-11 ENCOUNTER — Other Ambulatory Visit: Payer: Self-pay | Admitting: Family Medicine

## 2016-06-11 DIAGNOSIS — I10 Essential (primary) hypertension: Secondary | ICD-10-CM

## 2016-08-30 ENCOUNTER — Ambulatory Visit (INDEPENDENT_AMBULATORY_CARE_PROVIDER_SITE_OTHER): Payer: BC Managed Care – PPO | Admitting: Family Medicine

## 2016-08-30 ENCOUNTER — Encounter: Payer: Self-pay | Admitting: Family Medicine

## 2016-08-30 VITALS — BP 122/76 | HR 80 | Temp 99.0°F | Ht 68.5 in | Wt 213.0 lb

## 2016-08-30 DIAGNOSIS — J069 Acute upper respiratory infection, unspecified: Secondary | ICD-10-CM | POA: Diagnosis not present

## 2016-08-30 DIAGNOSIS — B9789 Other viral agents as the cause of diseases classified elsewhere: Secondary | ICD-10-CM | POA: Diagnosis not present

## 2016-08-30 NOTE — Patient Instructions (Signed)
  Drink plenty of water. Continue Dayquil/nyquil.  Luckily, your blood pressure seems to be tolerating the use of decongestants, so it is okay to continue. Consider doing sinus rinses if your sinus pressure returns. Add Mucinex 600-1200mg  twice daily (12 hour kind, vs following the directions on another version of guaifenesin). Guaifenesin is the expectorant that will help keep the mucus and phlegm thin, and clear it better from your sinuses and chest (bronchial tubes).  If you start having increasing fever, increasing discolored mucus/phlegm, contact us (through MyChart or by phone) for possible antibiotic. If you develop chest pain, shortness of breath--please return for re-evaluation  This time of year, people with spring allergies start to have allergy flares (tree pollens are rising). Consider taking claritin/zyrtec for any itchy eyes, runny nose, sneezing or ongoing postnasal drainage.

## 2016-08-30 NOTE — Progress Notes (Signed)
Chief Complaint  Patient presents with  . Cough    started last Friday with body aches. Sinus pain and pressure and is blowing nose a lot.  Has progressed to cough and chest congestion as well as headache. Hasn't taken temp bur doesn't think she has had any fevers, no chills. When she lays down she can hear a "gurgle/rattle" in her chest. Mucus is pale yellow.    6-7 days ago she started with cough, congestion and some body aches.  She didn't have fever. She took Nyquil, Dayquil and ibuprofen, which helps some.  She has started hearing a rattle in her chest for the last 2 days, with ongoing cough.  Dayquil used to help the cough, now it doesn't seem to be helping.  Cough is mostly nonproductive (hears it in her chest, but can't get it up); only a small amount of phlegm is produced throughout the day, stringy/mucoid, very pale yellow. +sinus pressure, worse yesterday.  Body aches have improved, mostly just in the evenings.  +sick contacts (students at school)  [place], [facility] reviewed  Outpatient Encounter Prescriptions as of 08/30/2016  Medication Sig  . amLODipine (NORVASC) 5 MG tablet TAKE 1 AND 1/2 TABLETS     (7.5MG  TOTAL) DAILY  . Cholecalciferol (VITAMIN D) 2000 UNITS CAPS Take 1 capsule by mouth daily. Reported on 11/23/2015  . ferrous sulfate 325 (65 FE) MG tablet Take 325 mg by mouth daily with breakfast.  . [DISCONTINUED] levofloxacin (LEVAQUIN) 750 MG tablet Take 1 tablet (750 mg total) by mouth daily.   No facility-administered encounter medications on file as of 08/30/2016.    No Known Allergies  ROS: no chills, dizziness.  Some frontal headaches and in her cheeks. Denies nausea, vomiting, diarrhea, bleeding, bruising, rash.  No chest pain, palpitations.  PHYSICAL EXAM:  BP 122/76 (BP Location: Left Arm, Patient Position: Sitting, Cuff Size: Normal)   Pulse 80   Temp 99 F (37.2 C) (Tympanic)   Ht 5' 8.5" (1.74 m)   Wt 213 lb (96.6 kg)   LMP 08/17/2016   BMI 31.92 kg/m    Well appearing, pleasant female, in good spirits.  Occasional cough noted.  No "rattling" or abnormal sounds appreciated when laying supine (as reported by patient when at home) HEENT: PERRL, EOMI, conjunctiva and sclera are clear. TM's and EAC's normal.  Nasal mucosa is moderately edematous. Clear mucus seen. No erythema or purulence. Sinuses are nontender OP is clear, moist mucus membranes, no erythema. Neck: no lymphadenopathy, thyromegaly or mass Heart: regular rate and rhythm, no murmur Lungs: clear bilaterally, good air movement.  No wheezes, rales, ronchi Extremities: no edema Neuro: alert and oriented, cranial nerves intact, normal gait Psych: normal mood, affect, hygiene and grooming Skin: normal turgor, no rash   ASSESSMENT/PLAN:   Viral upper respiratory tract infection - supportive measures reviewed.  Reviewed s/sx bacterial infection. add guaifenesin (mucinex); continue other OTC's   Drink plenty of water. Continue Dayquil/nyquil.  Luckily, your blood pressure seems to be tolerating the use of decongestants, so it is okay to continue. Consider doing sinus rinses if your sinus pressure returns. Add Mucinex 600-1200mg  twice daily (12 hour kind, vs following the directions on another version of guaifenesin). Guaifenesin is the expectorant that will help keep the mucus and phlegm thin, and clear it better from your sinuses and chest (bronchial tubes).  If you start having increasing fever, increasing discolored mucus/phlegm, contact us (through MyChart or by phone) for possible antibiotic. If you develop chest pain,  shortness of breath--please return for re-evaluation  This time of year, people with spring allergies start to have allergy flares (tree pollens are rising). Consider taking claritin/zyrtec for any itchy eyes, runny nose, sneezing or ongoing postnasal drainage.

## 2016-12-18 NOTE — Progress Notes (Signed)
Chief Complaint  Patient presents with  . Annual Exam    fasting (in lab) annual exam with pap. Will have eyes checked at eye doctor [facility]. Has a vein in her right foot (top) that pops up from time to time and she gets a tingling/numbing sensation that goes up her leg x 2 weeks.     Isabella Rodgers is a 46 y.o. female who presents for a complete physical.  She has the following concerns:  She has a "vein" on top of the right foot that when she touches the proximal portion of this area, it shoots a tingling sensation (like numb) into the 2nd toe. When she lays down and curls her toes, it feels similarly (tingling).  Hypertension: She is compliant with taking the amlodipine 7.92m daily. Her BP's had been running 120/80's. Denies any dizziness, headaches, edema (only if eats salty foods), chest pain, palpitations. Denies side effects to medications.    Vitamin D deficiency: Her last level was 24 last year, and she was advised to take 2000 IU of D3 regularly.  She has been compliant in taking 2000 IU daily.  H/o Iron deficiency anemia--had improved s/p ablation. She still has periods, they are back to being normal, not heavy and no cramping.  Denies dizziness, fatigue. Taking iron supplement once daily.   Immunization History  Administered Date(s) Administered  . Influenza, Seasonal, Injecte, Preservative Fre 07/07/2012  . Influenza,inj,Quad PF,36+ Mos 04/15/2014, 04/13/2015  . PPD Test 02/10/2016  . Tdap 09/17/2012   Last Pap smear: 08/2013 (prior to ablation), at her GYN. She may have had once since, but not recently. H/o cervical polyps, which were removed. Denies abnormal paps. Her GYN is retiring, so she will get her pap today here. Last mammogram: she has had 2 mammograms, through her GYN, last was over 2 years ago.   Last colonoscopy: never Last DEXA: never Dentist: twice yearly Ophtho: every 1-2 years (wears contacts) Exercise: Gets 50 minutes/d (per her Apple Watch).   30 minutes of Zumba in the morning, walks at lunch, sometimes does another walk or 20 min walk in the evenings.  Does weights with Zumba.  Admits to being on this regular routine only for the last 2.5 weeks.  Past Medical History:  Diagnosis Date  . Hypertension   . Iron deficiency anemia    hx--resolved after ablation  . Serum sodium elevated    hx of  . Unspecified vitamin D deficiency     Past Surgical History:  Procedure Laterality Date  . CYST EXCISION     left shoulder and left leg  . ENDOMETRIAL ABLATION  09/28/2013  . TUBAL LIGATION      Social History   Social History  . Marital status: Married    Spouse name: N/A  . Number of children: 2  . Years of education: N/A   Occupational History  . [employer]   Social History Main Topics  . Smoking status: Never Smoker  . Smokeless tobacco: Never Used  . Alcohol use No  . Drug use: No  . Sexual activity: Yes    Partners: Male    Birth control/ protection: Surgical   Other Topics Concern  . Not on file   Social History Narrative   Lives with her husband, and youngest daughter (Isabella Rodgers; [place]  Divorced 07/2012; Remarried 01/2013.   [facility]   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   Husband is a Administrator (only home on the weekends)    Family History  Problem Relation Age of Onset  . Diabetes Mother   . Hyperlipidemia Mother   . Hypertension Mother   . Arthritis Mother   . Diabetes Father   . Hyperlipidemia Father   . Hypertension Father   . Coronary artery disease Maternal Aunt        died at 53  . Diabetes Maternal Grandmother   . Arthritis Maternal Grandmother   . Coronary artery disease Maternal Grandfather        50's  . Diabetes Maternal Grandfather   . Diabetes Paternal Grandmother   . Cancer Paternal Grandfather        prostate  . Diabetes  Paternal Grandfather   . Diabetes Maternal Uncle     Outpatient Encounter Prescriptions as of 12/19/2016  Medication Sig  . amLODipine (NORVASC) 5 MG tablet TAKE 1 AND 1/2 TABLETS     (7.5MG TOTAL) DAILY  . Cholecalciferol (VITAMIN D) 2000 UNITS CAPS Take 1 capsule by mouth daily. Reported on 11/23/2015  . ferrous sulfate 325 (65 FE) MG tablet Take 325 mg by mouth daily with breakfast.   No facility-administered encounter medications on file as of 12/19/2016.     No Known Allergies   ROS: The patient denies anorexia, fever, vision changes, decreased hearing, ear pain, sore throat, breast concerns, chest pain, palpitations, dizziness, syncope, dyspnea on exertion, cough, swelling, nausea, vomiting, diarrhea, constipation, abdominal pain, melena, hematochezia, indigestion/heartburn, hematuria, incontinence, dysuria, irregular menstrual cycles, vaginal discharge, odor or itch, genital lesions, joint pains, numbness, tingling, weakness, tremor, suspicious skin lesions, depression, anxiety, abnormal bleeding/bruising, or enlarged lymph nodes. Occasional headaches, in the frontal region--less frequent since changing jobs.  Some snoring, per husband, no apnea.  Doesn't feel tired. Very intermittent trouble swallowing, sometimes at night after a late dinner.  Tingling in foot as per HPI when touching a specific area.    PHYSICAL EXAM:  BP 120/82 (BP Location: Left Arm, Patient Position: Sitting, Cuff Size: Normal)   Pulse 72   Ht _0  (1.753 m)   Wt 209 lb 6.4 oz (95 kg)   LMP 12/03/2016 (Approximate)   BMI 30.92 kg/m    Wt Readings from Last 3 Encounters:  12/19/16 209 lb 6.4 oz (95 kg)  08/30/16 213 lb (96.6 kg)  03/13/16 207 lb 6.4 oz (94.1 kg)     General Appearance:   Alert, cooperative, no distress, appears stated age  Head:   Normocephalic, without obvious abnormality, atraumatic  Eyes:   PERRL, conjunctiva/corneas clear, EOM's intact, fundi benign  Ears:    Normal TM's and external ear canals  Nose:  Nares normal, moderate edema of nasal mucosa, clear drainage, no sinustenderness  Throat:  Lips, mucosa, and tongue normal; teeth and gums normal  Neck:  Supple, no lymphadenopathy; thyroid: no enlargement/tenderness/nodules; no carotid bruit or JVD  Back:  Spine nontender, no curvature, ROM normal, no CVA tenderness  Lungs:   Clear to auscultation bilaterally without wheezes, rales orronchi; respirations unlabored  Chest Wall:   No tenderness or deformity  Heart:   Regular rate and rhythm, S1 and S2 normal, no murmur, rub or gallop  Breast Exam:   no nipple inversion, discharge, skin dimpling, masses or axillary lymphadenopathy  Abdomen:   Soft, non-tender, nondistended, normoactive bowel sounds, no masses, no hepatosplenomegaly  Genitalia:   normal external genitalia, BUS and vagina normal. No abnormal  vaginal discharge. Cervix is normal, no lesions, no discharge. No cervical motion tenderness. Uterus and adnexa normal, nontender, no mass. Pap performed  Rectal:  normal sphincter tone, no mass, heme negative stool  Extremities:  No clubbing, cyanosis or edema. Soft tissue swelling in proximal portion of right mid foot.  No discrete mass or cyst.  Tender to touch and shoots discomfort down the foot.  Pulses:  2+ and symmetric all extremities  Skin:  Skin color, texture, turgor normal, no rashes or lesions  Lymph nodes:  Cervical, supraclavicular, and axillary nodes normal  Neurologic:  CNII-XII intact, normal strength, sensation and gait; reflexes 2+ and symmetric throughout  Psych: Normal mood, affect, hygiene and grooming    ASSESSMENT/PLAN:  Annual physical exam - Plan: POCT Urinalysis Dipstick, Lipid panel, Comprehensive metabolic panel, CBC with Differential/Platelet, VITAMIN D 25 Hydroxy (Vit-D Deficiency, Fractures), TSH, Cytology - PAP   Essential  hypertension, benign - well controlled - Plan: Lipid panel, Comprehensive metabolic panel, amLODipine (NORVASC) 5 MG tablet  Vitamin D deficiency - complaint with daily supplement - Plan: VITAMIN D 25 Hydroxy (Vit-D Deficiency, Fractures)  Essential hypertension, benign - Plan: Lipid panel, Comprehensive metabolic panel, amLODipine (NORVASC) 5 MG tablet  Right foot pain - suspect either mild tendonitis, neuroma--soft tissue inflammation. Trial of short course of NSAID (OTC)   c-met, CBC, TSH, lipids, Vit D  Discussed monthly self breast exams and yearly mammograms; at least 30 minutes of aerobic activity at least 5 days/week, weight bearing exercise 2x/wk; proper sunscreen use reviewed; healthy diet, including goals of calcium and vitamin D intake and alcohol recommendations (less than or equal to 1 drink/day) reviewed; regular seatbelt use; changing batteries in smoke detectors. Immunization recommendations discussed--yearly flu shots, Shingrix age 78. Colonoscopy recommendations reviewed, age 65, (sooner prn)

## 2016-12-19 ENCOUNTER — Other Ambulatory Visit (HOSPITAL_COMMUNITY)
Admission: RE | Admit: 2016-12-19 | Discharge: 2016-12-19 | Disposition: A | Discharge status: Home / Self Care | Payer: BC Managed Care – PPO | Source: Ambulatory Visit | Attending: Family Medicine | Admitting: Family Medicine

## 2016-12-19 ENCOUNTER — Ambulatory Visit (INDEPENDENT_AMBULATORY_CARE_PROVIDER_SITE_OTHER): Payer: BC Managed Care – PPO | Admitting: Family Medicine

## 2016-12-19 ENCOUNTER — Encounter: Payer: Self-pay | Admitting: Family Medicine

## 2016-12-19 VITALS — BP 120/82 | HR 72 | Ht 69.0 in | Wt 209.4 lb

## 2016-12-19 DIAGNOSIS — E559 Vitamin D deficiency, unspecified: Secondary | ICD-10-CM

## 2016-12-19 DIAGNOSIS — Z0000 Encounter for general adult medical examination without abnormal findings: Secondary | ICD-10-CM

## 2016-12-19 DIAGNOSIS — I10 Essential (primary) hypertension: Secondary | ICD-10-CM

## 2016-12-19 DIAGNOSIS — M79671 Pain in right foot: Secondary | ICD-10-CM

## 2016-12-19 LAB — POCT URINALYSIS DIPSTICK
Bilirubin, UA: NEGATIVE
GLUCOSE UA: NEGATIVE
Leukocytes, UA: NEGATIVE
Nitrite, UA: NEGATIVE
PH UA: 6 (ref 5.0–8.0)
Protein, UA: NEGATIVE
RBC UA: NEGATIVE
SPEC GRAV UA: 1.025 (ref 1.010–1.025)
UROBILINOGEN UA: NEGATIVE U/dL — AB

## 2016-12-19 LAB — CBC WITH DIFFERENTIAL/PLATELET
BASOS ABS: 0 {cells}/uL (ref 0–200)
Basophils Relative: 0 %
EOS PCT: 2 %
Eosinophils Absolute: 92 cells/uL (ref 15–500)
HCT: 39 % (ref 35.0–45.0)
Hemoglobin: 13 g/dL (ref 11.7–15.5)
Lymphocytes Relative: 45 %
Lymphs Abs: 2070 cells/uL (ref 850–3900)
MCH: 28.5 pg (ref 27.0–33.0)
MCHC: 33.3 g/dL (ref 32.0–36.0)
MCV: 85.5 fL (ref 80.0–100.0)
MONOS PCT: 5 %
MPV: 9.7 fL (ref 7.5–12.5)
Monocytes Absolute: 230 cells/uL (ref 200–950)
NEUTROS PCT: 48 %
Neutro Abs: 2208 cells/uL (ref 1500–7800)
PLATELETS: 245 10*3/uL (ref 140–400)
RBC: 4.56 MIL/uL (ref 3.80–5.10)
RDW: 13.2 % (ref 11.0–15.0)
WBC: 4.6 10*3/uL (ref 4.0–10.5)

## 2016-12-19 LAB — TSH: TSH: 1.08 mIU/L

## 2016-12-19 MED ORDER — AMLODIPINE BESYLATE 5 MG PO TABS: ORAL_TABLET | ORAL | 3 refills | Status: DC

## 2016-12-19 NOTE — Patient Instructions (Signed)

## 2016-12-20 LAB — COMPREHENSIVE METABOLIC PANEL
ALK PHOS: 54 U/L (ref 33–115)
ALT: 11 U/L (ref 6–29)
AST: 14 U/L (ref 10–30)
Albumin: 4 g/dL (ref 3.6–5.1)
BILIRUBIN TOTAL: 0.4 mg/dL (ref 0.2–1.2)
BUN: 13 mg/dL (ref 7–25)
CALCIUM: 8.6 mg/dL (ref 8.6–10.2)
CO2: 24 mmol/L (ref 20–31)
Chloride: 105 mmol/L (ref 98–110)
Creat: 0.88 mg/dL (ref 0.50–1.10)
GLUCOSE: 84 mg/dL (ref 65–99)
Potassium: 4.2 mmol/L (ref 3.5–5.3)
Sodium: 137 mmol/L (ref 135–146)
TOTAL PROTEIN: 6.9 g/dL (ref 6.1–8.1)

## 2016-12-20 LAB — LIPID PANEL
CHOLESTEROL: 137 mg/dL (ref <200)
HDL: 37 mg/dL — ABNORMAL LOW (ref >50)
LDL CALC: 89 mg/dL (ref <100)
Total CHOL/HDL Ratio: 3.7 Ratio (ref <5.0)
Triglycerides: 57 mg/dL (ref <150)
VLDL: 11 mg/dL (ref <30)

## 2016-12-20 LAB — VITAMIN D 25 HYDROXY (VIT D DEFICIENCY, FRACTURES): VIT D 25 HYDROXY: 38 ng/mL (ref 30–100)

## 2016-12-22 LAB — CYTOLOGY - PAP
Diagnosis: NEGATIVE
HPV: NOT DETECTED

## 2017-05-21 ENCOUNTER — Encounter: Payer: Self-pay | Admitting: Family Medicine

## 2017-05-21 ENCOUNTER — Ambulatory Visit: Payer: BC Managed Care – PPO | Admitting: Family Medicine

## 2017-05-21 VITALS — BP 134/84 | HR 86 | Wt 214.2 lb

## 2017-05-21 DIAGNOSIS — R1030 Lower abdominal pain, unspecified: Secondary | ICD-10-CM | POA: Diagnosis not present

## 2017-05-21 DIAGNOSIS — R10813 Right lower quadrant abdominal tenderness: Secondary | ICD-10-CM | POA: Diagnosis not present

## 2017-05-21 LAB — POCT URINALYSIS DIP (PROADVANTAGE DEVICE)
BILIRUBIN UA: NEGATIVE
BILIRUBIN UA: NEGATIVE mg/dL
Blood, UA: NEGATIVE
Glucose, UA: NEGATIVE mg/dL
LEUKOCYTES UA: NEGATIVE
Nitrite, UA: NEGATIVE
Protein Ur, POC: NEGATIVE mg/dL
Specific Gravity, Urine: 1.015
Urobilinogen, Ur: NEGATIVE
pH, UA: 8 (ref 5.0–8.0)

## 2017-05-21 NOTE — Patient Instructions (Addendum)
Keep an eye on your symptoms and if you develop fever, vomiting or if the severe pain returns then call or return.  If you notice any new urinary symptoms call.   You may take tylenol or ibuprofen if needed for pain.

## 2017-05-21 NOTE — Progress Notes (Signed)
Subjective:    Patient ID: Isabella Rodgers, female    DOB: 1970-07-11, 46 y.o.   MRN: [id number]  HPI Chief Complaint  Patient presents with  . Abdominal Pain    lower stomach pain, lighthead,started today    She is here with complaints of one episode of acute non radiating sharp pain in her RLQ that occurred this morning while driving to work. Reports being at her usual state of health prior to onset.  Sharp pain lasted approximately 1 hour and resolved spontaneously. No associated fever, chills, chest pain, N/V/D, or back pain.  Complains of an intermittent "soreness" now in that area. Pain is now 2 or 3/10 compared to a 10/10 earlier. She felt flushed and lightheaded when the sharp pain was present but this resolved with the severe pain.   Has not taken anything for pain.   States her urine was a "pinkish" color this morning but back to normal since then. She does note a mild lower abdominal pressure with urination for the past 1-2 days. Denies history of UTI in past. Denies urinary frequency, urgency, odor, leakage.  No vaginal discharge or dyspareunia.   LMP: last week and her norm. No recent sexual acitivity.   Reports having a normal bowel movement this morning and no blood in stool.   Recent pap smear in June 2018. History of tubal ligation and endometrial ablation.   Reviewed allergies, medications, past medical, surgical, and social history.   Review of Systems Pertinent positives and negatives in the history of present illness.     Objective:   Physical Exam  Constitutional: She is oriented to person, place, and time. She appears well-developed and well-nourished. She does not have a sickly appearance. No distress.  HENT:  Mouth/Throat: Oropharynx is clear and moist.  Eyes: Conjunctivae are normal. Pupils are equal, round, and reactive to light.  Neck: Normal range of motion. Neck supple.  Cardiovascular: Normal rate, regular rhythm, normal heart sounds and intact  distal pulses.  Pulmonary/Chest: Effort normal and breath sounds normal.  Abdominal: Soft. Bowel sounds are normal. There is no hepatosplenomegaly. There is tenderness in the right lower quadrant. There is no rigidity, no rebound, no guarding, no CVA tenderness, no tenderness at McBurney's point and negative Murphy's sign.    Negative psoas and obturator tests.   Genitourinary:  Genitourinary Comments: declines  Lymphadenopathy:    She has no cervical adenopathy.  Neurological: She is alert and oriented to person, place, and time. No cranial nerve deficit or sensory deficit. Gait normal.  Skin: Skin is warm and dry. No rash noted. No pallor.  Psychiatric: She has a normal mood and affect. Her speech is normal and behavior is normal. Thought content normal.   BP 134/84   Pulse 86   Wt 214 lb 3.2 oz (97.2 kg)   LMP 05/17/2017   SpO2 98%   BMI 31.63 kg/m       Assessment & Plan:  Lower abdominal pain - Plan: POCT Urinalysis DIP (Proadvantage Device)  Right lower quadrant abdominal tenderness without rebound tenderness  UA dipstick: negative  No obvious infectious process. Discussed potential etiologies. She may have an early UTI but prefers to hold off on treatment since UA did not show signs of infection. Considered appendicitis which is very unlikely based on H & P. Discussed potential red flags and when to seek immediate medical care. She may try Tylenol or ibuprofen for pain.  For now, we decided to do watchful waiting and she  will report back any new or worsening symptoms.

## 2017-05-30 ENCOUNTER — Encounter: Payer: Self-pay | Admitting: Family Medicine

## 2017-05-30 ENCOUNTER — Ambulatory Visit
Admission: RE | Admit: 2017-05-30 | Discharge: 2017-05-30 | Disposition: A | Discharge status: Home / Self Care | Payer: BC Managed Care – PPO | Source: Ambulatory Visit | Attending: Family Medicine | Admitting: Family Medicine

## 2017-05-30 ENCOUNTER — Ambulatory Visit: Payer: BC Managed Care – PPO | Admitting: Family Medicine

## 2017-05-30 VITALS — BP 128/86 | HR 77 | Temp 98.0°F | Wt 215.0 lb

## 2017-05-30 DIAGNOSIS — K5900 Constipation, unspecified: Secondary | ICD-10-CM | POA: Diagnosis not present

## 2017-05-30 DIAGNOSIS — R1032 Left lower quadrant pain: Secondary | ICD-10-CM | POA: Diagnosis not present

## 2017-05-30 DIAGNOSIS — N923 Ovulation bleeding: Secondary | ICD-10-CM | POA: Diagnosis not present

## 2017-05-30 LAB — POCT URINALYSIS DIP (PROADVANTAGE DEVICE)
BILIRUBIN UA: NEGATIVE
Glucose, UA: NEGATIVE mg/dL
Ketones, POC UA: NEGATIVE mg/dL
LEUKOCYTES UA: NEGATIVE
NITRITE UA: NEGATIVE
PH UA: 6 (ref 5.0–8.0)
Specific Gravity, Urine: 1.03
Urobilinogen, Ur: NEGATIVE

## 2017-05-30 LAB — POCT URINE PREGNANCY: Preg Test, Ur: NEGATIVE

## 2017-05-30 MED ORDER — IBUPROFEN 800 MG PO TABS: 800.0000 mg | ORAL_TABLET | Freq: Three times a day (TID) | ORAL | 0 refills | Status: DC | PRN

## 2017-05-30 NOTE — Progress Notes (Signed)
Subjective:    Patient ID: Isabella Rodgers, female    DOB: 10/09/1970, 46 y.o.   MRN: [id number]  HPI Chief Complaint  Patient presents with  . Acute Visit    came last week, abd pain, never went away, bleeding started this morning; heavy, not time for period (LMP:05/12/17)  flutter on lower left side   She is here with ongoing dull intermittent LLQ pain.  Pain is nonradiating and is not worse with eating, urination, movement.   States she has been constipated since leaving here 05/21/2017 so she tried her husband's Linzess on 2 non consecutive days and drank a bottle magnesium citrate 4 days ago.  States she has had watery stools since but no formed stools.  She is concerned that she has a bowel obstruction.  Denies nausea or vomiting.  States she has a bad taste in her mouth and thinks this could be feces. Denies blood or pus.   States she had her usual menstrual cycle the week of Thanksgiving and then started bleeding again this morning.  States she is not having abdominal cramping like she normally does with her periods. Normally her periods are regular LMP: 05/12/2017 Tubal ligation and endometrial ablation for heavy bleeding and anemia.  OB/GYN is in [place] but he is retiring.  Normal pap smear done by her PCP, Dr. Tomi Bamberger in June.   Denies fever, chills, dizziness, chest pain, palpitations, shortness of breath, back pain, urinary symptoms, vaginal discharge.   Reviewed allergies, medications, past medical, surgical, family, and social history.   Review of Systems Pertinent positives and negatives in the history of present illness.     Objective:   Physical Exam  Constitutional: She is oriented to person, place, and time. She appears well-developed and well-nourished. She does not have a sickly appearance. No distress.  HENT:  Mouth/Throat: Oropharynx is clear and moist and mucous membranes are normal.  Eyes: Conjunctivae are normal.  Cardiovascular: Normal rate, regular rhythm  and normal heart sounds.  Pulmonary/Chest: Effort normal and breath sounds normal.  Abdominal: Soft. Normal appearance. She exhibits no distension. Bowel sounds are decreased. There is no hepatosplenomegaly. There is tenderness. There is no rigidity, no rebound, no guarding, no CVA tenderness, no tenderness at McBurney's point and negative Murphy's sign.    Genitourinary:  Genitourinary Comments: Refuses pelvic exam even though this would give [place] more information.   Neurological: She is alert and oriented to person, place, and time.  Skin: Skin is warm and dry. No rash noted. No pallor.  Psychiatric: She has a normal mood and affect. Her speech is normal and behavior is normal. Thought content normal.   BP 128/86 (BP Location: Right Arm, Patient Position: Sitting)   Pulse 77   Temp 98 F (36.7 C) (Oral)   Wt 215 lb (97.5 kg)   LMP 05/12/2017   SpO2 97%   BMI 31.75 kg/m       Assessment & Plan:  LLQ abdominal pain - Plan: POCT Urinalysis DIP (Proadvantage Device), POCT urine pregnancy, DG Abd 1 View, ibuprofen (ADVIL,MOTRIN) 800 MG tablet  Constipation, unspecified constipation type - Plan: DG Abd 1 View  Vaginal bleeding between periods - Plan: POCT Urinalysis DIP (Proadvantage Device), POCT urine pregnancy, ibuprofen (ADVIL,MOTRIN) 800 MG tablet  Discussed potential etiologies for left lower quadrant pain including diverticulitis however this is unlikely since her pain is smoldering and not worsening over the past week or so.  No obvious infectious illness. KUB ordered to rule out bowel obstruction  but this is unlikely.  This is her biggest concern today.  UPT negative, urinalysis dipstick negative. It is unclear as to why she is having vaginal bleeding.  We will have her take ibuprofen 800 mg 3 times daily for now.  Plan to have her follow-up next week or sooner if needed.  Discussed keeping a close eye on her symptoms and calling if she has any new or worsening symptoms.  Consider  CT.

## 2017-06-04 ENCOUNTER — Encounter: Payer: Self-pay | Admitting: Family Medicine

## 2017-11-11 ENCOUNTER — Telehealth: Payer: Self-pay

## 2017-11-11 NOTE — Telephone Encounter (Signed)
[facility] she can just call there and schedule.

## 2017-11-11 NOTE — Telephone Encounter (Signed)
Patient called and wants a new order for her mammogram sent, she had cancel the appointment and by the time she rescheduled the order had expired.   Patient does have appointment for her CPE 12-30-17.   Can you please advise?   Thank you

## 2017-11-11 NOTE — Telephone Encounter (Signed)
Should be screening mammo and no order is needed.  Not sure where she was trying to schedule

## 2017-12-29 NOTE — Progress Notes (Signed)
Chief Complaint  Patient presents with  . Annual Exam    fasting annual exam with pelvic, had one here last year. Just had eye exam. Has had some tightening in her neck recently when she turns her head from time to time.     Isabella Rodgers is a 47 y.o. female who presents for a complete physical.  She has the following concerns:  She gets some tightness in her neck (almost like a crick) when she turns her head. Short-lived. Occurs on both sides of neck, sporadic over the last 6 months, sometime 2 weeks without a problem. No radiation into the arms, no numbness, tingling, weakness.  She saw Vickie in December with LLQ pain and constipation. She denies any further problems with pain or constipation.  Hypertension: She is compliant with taking the amlodipine 7.71m daily.Her BP's had been running 120/80's (mid). Denies any dizziness, edema (only if eats salty foods), chest pain, palpitations. Denies side effects to medications.   Vitamin D deficiency: Her last level was 38 last year, had been low at 24 the prior year. She continues to take 2000 IU daily.  H/o Iron deficiency anemia--had improved s/p ablation. She still has periods, no longer heavy and no cramping. Denies dizziness, fatigue. Taking iron supplement once daily.   Hgb last year was normal at 13.   Immunization History  Administered Date(s) Administered  . Influenza, Seasonal, Injecte, Preservative Fre 07/07/2012  . Influenza,inj,Quad PF,6+ Mos 04/15/2014, 04/13/2015  . PPD Test 02/10/2016  . Tdap 09/17/2012   Last Pap smear: 11/2016 normal, no high risk HPV Last mammogram: she has had 2 mammograms, through her GYN, last was over 3 years ago.  She called here in May to get order, advised not needed, [facility] Last colonoscopy: never Last DEXA: never Dentist: twice yearly Ophtho: yearly (wears contacts) Exercise: 20 minutes of Zumba daily at a minimum, up to 40 mins if her  schedule allows (with hand weights, and sometimes ankle weights).  Past Medical History:  Diagnosis Date  . Hypertension   . Iron deficiency anemia    hx--resolved after ablation  . Serum sodium elevated    hx of  . Unspecified vitamin D deficiency     Past Surgical History:  Procedure Laterality Date  . CYST EXCISION     left shoulder and left leg  . ENDOMETRIAL ABLATION  09/28/2013  . TUBAL LIGATION      Social History   Socioeconomic History  . Marital status: Married    Spouse name: Not on file  . Number of children: 2  . Years of education: Not on file  . Highest education level: Not on file  Occupational History  . Occupation: LManufacturing engineerGroup    Employer: [employer]  Social Needs  . Financial resource strain: Not on file  . Food insecurity:    Worry: Not on file    Inability: Not on file  . Transportation needs:    Medical: Not on file    Non-medical: Not on file  Tobacco Use  . Smoking status: Never Smoker  . Smokeless tobacco: Never Used  Substance and Sexual Activity  . Alcohol use: No    Alcohol/week: 0.0 oz  . Drug use: No  . Sexual activity: Yes    Partners: Male    Birth control/protection: Surgical  Lifestyle  . Physical activity:    Days per week: Not on file    Minutes per session:  Not on file  . Stress: Not on file  Relationships  . Social connections:    Talks on phone: Not on file    Gets together: Not on file    Attends religious service: Not on file    Active member of club or organization: Not on file    Attends meetings of clubs or organizations: Not on file    Relationship status: Not on file  . Intimate partner violence:    Fear of current or ex partner: Not on file    Emotionally abused: Not on file    Physically abused: Not on file    Forced sexual activity: Not on file  Other Topics Concern  . Not on file  Social History Narrative   Lives with her husband, and youngest daughter [name]); oldest daughter  graduated college and still lives in [place].   Divorced 07/2012; Remarried 01/2013.   Left [facility] in May 2017.   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   Husband is a Administrator (only home on the weekends)    Family History  Problem Relation Age of Onset  . Diabetes Mother   . Hyperlipidemia Mother   . Hypertension Mother   . Arthritis Mother   . Diabetes Father   . Hyperlipidemia Father   . Hypertension Father   . Kidney cancer Father 63  . Prostate cancer Father   . Cancer Father   . Kidney failure Father        on dialysis  . Stomach cancer Brother 83       smoker  . Coronary artery disease Maternal Aunt        died at 29  . Diabetes Maternal Grandmother   . Arthritis Maternal Grandmother   . Coronary artery disease Maternal Grandfather        50's  . Diabetes Maternal Grandfather   . Diabetes Paternal Grandmother   . Cancer Paternal Grandfather        prostate  . Diabetes Paternal Grandfather   . Diabetes Maternal Uncle     Outpatient Encounter Medications as of 12/30/2017  Medication Sig  . amLODipine (NORVASC) 5 MG tablet TAKE 1 AND 1/2 TABLETS     (7.5MG TOTAL) DAILY  . Cholecalciferol (VITAMIN D) 2000 UNITS CAPS Take 1 capsule by mouth daily. Reported on 11/23/2015  . ferrous sulfate 325 (65 FE) MG tablet Take 325 mg by mouth daily with breakfast.  . [DISCONTINUED] amLODipine (NORVASC) 5 MG tablet TAKE 1 AND 1/2 TABLETS     (7.5MG TOTAL) DAILY  . ibuprofen (ADVIL,MOTRIN) 800 MG tablet Take 1 tablet (800 mg total) by mouth every 8 (eight) hours as needed. (Patient not taking: Reported on 12/30/2017)   No facility-administered encounter medications on file as of 12/30/2017.     No Known Allergies   ROS: The patient denies anorexia, fever, vision changes, decreased hearing, ear pain, sore throat, breast concerns, chest pain, palpitations, dizziness, syncope, dyspnea on exertion,  cough, swelling, nausea, vomiting, diarrhea, constipation, abdominal pain, melena, hematochezia, indigestion/heartburn, hematuria, incontinence, dysuria, irregular menstrual cycles, vaginal discharge, odor or itch, genital lesions, joint pains, numbness, tingling, weakness, tremor, suspicious skin lesions, depression, anxiety, abnormal bleeding/bruising, or enlarged lymph nodes. Occasional headaches, seems to be prior to her cycles, relieved by ibuprofen.  Some snoring, per husband, no apnea. Doesn't feel tired. Very intermittent trouble swallowing, sometimes at night after a late dinner.  Less often as in the past,  no longer eats past 7. 10# wt lost in the last 7-8 mos (intentional)    PHYSICAL EXAM:  BP 118/84   Pulse 80   Ht 5' 8.75" (1.746 m)   Wt 204 lb (92.5 kg)   LMP 12/24/2017   BMI 30.35 kg/m    Wt Readings from Last 3 Encounters:  12/30/17 204 lb (92.5 kg)  05/30/17 215 lb (97.5 kg)  05/21/17 214 lb 3.2 oz (97.2 kg)    General Appearance:   Alert, cooperative, no distress, appears stated age  Head:   Normocephalic, without obvious abnormality, atraumatic  Eyes:   PERRL, conjunctiva/corneas clear, EOM's intact, fundi benign  Ears:   Normal TM's and external ear canals  Nose:  Nares normal, mild edema of nasal mucosa, no drainage or sinustenderness  Throat:  Lips, mucosa, and tongue normal; teeth and gums normal  Neck:  Supple, no lymphadenopathy; thyroid: no enlargement/ tenderness/nodules; no carotid bruit or JVD  Back:  Spine nontender, no curvature, ROM normal, no CVA tenderness  Lungs:   Clear to auscultation bilaterally without wheezes, rales orronchi; respirations unlabored  Chest Wall:   No tenderness or deformity  Heart:   Regular rate and rhythm, S1 and S2 normal, no murmur, rub or gallop  Breast Exam:  No nipple inversion, discharge, skin dimpling, masses or axillary lymphadenopathy  Abdomen:   Soft, non-tender,  nondistended, normoactive bowel sounds, no masses, no hepatosplenomegaly  Genitalia:   normal external genitalia, BUS and vagina normal. No abnormal vaginal discharge. No cervical motion tenderness. Uterus and adnexa normal, nontender, no mass. Pap not performed  Rectal:  Normal sphincter tone, no mass, heme negative stool  Extremities:  No clubbing, cyanosis or edema.  Pulses:  2+ and symmetric all extremities  Skin:  Skin color, texture, turgor normal, no rashes or lesions  Lymph nodes:  Cervical, supraclavicular, and axillary nodes normal  Neurologic:  CNII-XII intact, normal strength, sensation and gait; reflexes 2+ and symmetric throughout  Psych: Normal mood, affect, hygiene and grooming   ASSESSMENT/PLAN:  Annual physical exam - Plan: POCT Urinalysis DIP (Proadvantage Device), CBC with Differential/Platelet, Comprehensive metabolic panel, VITAMIN D 25 Hydroxy (Vit-D Deficiency, Fractures), Lipid panel  Essential hypertension, benign - well controlled - Plan: amLODipine (NORVASC) 5 MG tablet, Comprehensive metabolic panel, Lipid panel  Vitamin D deficiency - continue daily replacement - Plan: VITAMIN D 25 Hydroxy (Vit-D Deficiency, Fractures)  Obesity (BMI 30.0-34.9) - congratulated on weight loss, encouraged more. Discussed healthy diet, exercise in detail    H/o iron deficiency anemia.  Last Hgb normal, menses now lighter.  Can assess based on current CBC whether or not she needs to continue daily iron (likely can stop vs changing to just with her cycles, if Hgb remains normal).  c-met, CBC, lipid, D   Discussed monthly self breast exams and yearly mammograms (past due, reminded to schedule); at least 30 minutes of aerobic activity at least 5 days/week, weight bearing exercise 2x/wk; proper sunscreen use reviewed; healthy diet, including goals of calcium and vitamin D intake and alcohol recommendations (less than or equal to 1 drink/day)  reviewed; regular seatbelt use; changing batteries in smoke detectors. Immunization recommendations discussed--continue yearly flu shots, Shingrix age 9. Colonoscopy recommendations reviewed, age 79, (sooner prn or if/when guidelines/insurance coverage changes)   F/u 1 year, sooner prn

## 2017-12-30 ENCOUNTER — Encounter: Payer: Self-pay | Admitting: Family Medicine

## 2017-12-30 ENCOUNTER — Ambulatory Visit: Payer: BC Managed Care – PPO | Admitting: Family Medicine

## 2017-12-30 VITALS — BP 118/84 | HR 80 | Ht 68.75 in | Wt 204.0 lb

## 2017-12-30 DIAGNOSIS — I10 Essential (primary) hypertension: Secondary | ICD-10-CM

## 2017-12-30 DIAGNOSIS — E669 Obesity, unspecified: Secondary | ICD-10-CM

## 2017-12-30 DIAGNOSIS — Z0000 Encounter for general adult medical examination without abnormal findings: Secondary | ICD-10-CM | POA: Diagnosis not present

## 2017-12-30 DIAGNOSIS — E559 Vitamin D deficiency, unspecified: Secondary | ICD-10-CM | POA: Diagnosis not present

## 2017-12-30 LAB — POCT URINALYSIS DIP (PROADVANTAGE DEVICE)
BILIRUBIN UA: NEGATIVE mg/dL
Bilirubin, UA: NEGATIVE
Blood, UA: NEGATIVE
Glucose, UA: NEGATIVE mg/dL
Leukocytes, UA: NEGATIVE
NITRITE UA: NEGATIVE
PH UA: 5.5 (ref 5.0–8.0)
PROTEIN UA: NEGATIVE mg/dL
Specific Gravity, Urine: 1.03
UUROB: NEGATIVE

## 2017-12-30 MED ORDER — AMLODIPINE BESYLATE 5 MG PO TABS: ORAL_TABLET | ORAL | 3 refills | Status: DC

## 2017-12-30 NOTE — Patient Instructions (Signed)
  HEALTH MAINTENANCE RECOMMENDATIONS:  It is recommended that you get at least 30 minutes of aerobic exercise at least 5 days/week (for weight loss, you may need as much as 60-90 minutes). This can be any activity that gets your heart rate up. This can be divided in 10-15 minute intervals if needed, but try and build up your endurance at least once a week.  Weight bearing exercise is also recommended twice weekly.  Eat a healthy diet with lots of vegetables, fruits and fiber.  "Colorful" foods have a lot of vitamins (ie green vegetables, tomatoes, red peppers, etc).  Limit sweet tea, regular sodas and alcoholic beverages, all of which has a lot of calories and sugar.  Up to 1 alcoholic drink daily may be beneficial for women (unless trying to lose weight, watch sugars).  Drink a lot of water.  Calcium recommendations are 1200-1500 mg daily (1500 mg for postmenopausal women or women without ovaries), and vitamin D 1000 IU daily.  This should be obtained from diet and/or supplements (vitamins), and calcium should not be taken all at once, but in divided doses.  Monthly self breast exams and yearly mammograms for women over the age of 46 is recommended.  Sunscreen of at least SPF 30 should be used on all sun-exposed parts of the skin when outside between the hours of 10 am and 4 pm (not just when at beach or pool, but even with exercise, golf, tennis, and yard work!)  Use a sunscreen that says "broad spectrum" so it covers both UVA and UVB rays, and make sure to reapply every 1-2 hours.  Remember to change the batteries in your smoke detectors when changing your clock times in the spring and fall.  Use your seat belt every time you are in a car, and please drive safely and not be distracted with cell phones and texting while driving.  Please call the [facility] to schedule your screening mammogram (3D is recommended).

## 2017-12-31 LAB — CBC WITH DIFFERENTIAL/PLATELET
BASOS ABS: 0 10*3/uL (ref 0.0–0.2)
Basos: 0 %
EOS (ABSOLUTE): 0.2 10*3/uL (ref 0.0–0.4)
Eos: 3 %
Hematocrit: 39 % (ref 34.0–46.6)
Hemoglobin: 13.1 g/dL (ref 11.1–15.9)
IMMATURE GRANS (ABS): 0 10*3/uL (ref 0.0–0.1)
Immature Granulocytes: 0 %
LYMPHS: 45 %
Lymphocytes Absolute: 2.6 10*3/uL (ref 0.7–3.1)
MCH: 28.4 pg (ref 26.6–33.0)
MCHC: 33.6 g/dL (ref 31.5–35.7)
MCV: 85 fL (ref 79–97)
Monocytes Absolute: 0.3 10*3/uL (ref 0.1–0.9)
Monocytes: 5 %
NEUTROS ABS: 2.6 10*3/uL (ref 1.4–7.0)
Neutrophils: 47 %
PLATELETS: 234 10*3/uL (ref 150–450)
RBC: 4.61 x10E6/uL (ref 3.77–5.28)
RDW: 13.7 % (ref 12.3–15.4)
WBC: 5.7 10*3/uL (ref 3.4–10.8)

## 2017-12-31 LAB — COMPREHENSIVE METABOLIC PANEL
ALK PHOS: 65 IU/L (ref 39–117)
ALT: 14 IU/L (ref 0–32)
AST: 16 IU/L (ref 0–40)
Albumin/Globulin Ratio: 1.4 (ref 1.2–2.2)
Albumin: 4.3 g/dL (ref 3.5–5.5)
BILIRUBIN TOTAL: 0.3 mg/dL (ref 0.0–1.2)
BUN/Creatinine Ratio: 22 (ref 9–23)
BUN: 17 mg/dL (ref 6–24)
CHLORIDE: 104 mmol/L (ref 96–106)
CO2: 22 mmol/L (ref 20–29)
Calcium: 9.1 mg/dL (ref 8.7–10.2)
Creatinine, Ser: 0.77 mg/dL (ref 0.57–1.00)
GFR calc Af Amer: 109 mL/min/{1.73_m2} (ref >59)
GFR calc non Af Amer: 94 mL/min/{1.73_m2} (ref >59)
GLUCOSE: 82 mg/dL (ref 65–99)
Globulin, Total: 3 g/dL (ref 1.5–4.5)
Potassium: 4.4 mmol/L (ref 3.5–5.2)
Sodium: 140 mmol/L (ref 134–144)
TOTAL PROTEIN: 7.3 g/dL (ref 6.0–8.5)

## 2017-12-31 LAB — LIPID PANEL
CHOLESTEROL TOTAL: 159 mg/dL (ref 100–199)
Chol/HDL Ratio: 3.5 ratio (ref 0.0–4.4)
HDL: 46 mg/dL (ref >39)
LDL Calculated: 99 mg/dL (ref 0–99)
TRIGLYCERIDES: 69 mg/dL (ref 0–149)
VLDL Cholesterol Cal: 14 mg/dL (ref 5–40)

## 2017-12-31 LAB — VITAMIN D 25 HYDROXY (VIT D DEFICIENCY, FRACTURES): VIT D 25 HYDROXY: 34.7 ng/mL (ref 30.0–100.0)

## 2018-01-17 ENCOUNTER — Emergency Department (HOSPITAL_COMMUNITY): Payer: BC Managed Care – PPO

## 2018-01-17 ENCOUNTER — Emergency Department (HOSPITAL_COMMUNITY)
Admission: EM | Admit: 2018-01-17 | Discharge: 2018-01-17 | Disposition: A | Discharge status: Home / Self Care | Payer: BC Managed Care – PPO | Attending: Emergency Medicine | Admitting: Emergency Medicine

## 2018-01-17 ENCOUNTER — Encounter (HOSPITAL_COMMUNITY): Payer: Self-pay | Admitting: Emergency Medicine

## 2018-01-17 ENCOUNTER — Other Ambulatory Visit: Payer: Self-pay

## 2018-01-17 DIAGNOSIS — S62631A Displaced fracture of distal phalanx of left index finger, initial encounter for closed fracture: Secondary | ICD-10-CM | POA: Insufficient documentation

## 2018-01-17 DIAGNOSIS — Z23 Encounter for immunization: Secondary | ICD-10-CM | POA: Diagnosis not present

## 2018-01-17 DIAGNOSIS — Y939 Activity, unspecified: Secondary | ICD-10-CM | POA: Diagnosis not present

## 2018-01-17 DIAGNOSIS — W298XXA Contact with other powered powered hand tools and household machinery, initial encounter: Secondary | ICD-10-CM | POA: Insufficient documentation

## 2018-01-17 DIAGNOSIS — Z79899 Other long term (current) drug therapy: Secondary | ICD-10-CM | POA: Diagnosis not present

## 2018-01-17 DIAGNOSIS — S62661B Nondisplaced fracture of distal phalanx of left index finger, initial encounter for open fracture: Secondary | ICD-10-CM

## 2018-01-17 DIAGNOSIS — Y999 Unspecified external cause status: Secondary | ICD-10-CM | POA: Diagnosis not present

## 2018-01-17 DIAGNOSIS — Y929 Unspecified place or not applicable: Secondary | ICD-10-CM | POA: Diagnosis not present

## 2018-01-17 DIAGNOSIS — I10 Essential (primary) hypertension: Secondary | ICD-10-CM | POA: Diagnosis not present

## 2018-01-17 MED ORDER — OXYCODONE-ACETAMINOPHEN 5-325 MG PO TABS
1.0000 | ORAL_TABLET | ORAL | Status: DC | PRN
  Administered 2018-01-17: 1 via ORAL
  Filled 2018-01-17: qty 1

## 2018-01-17 MED ORDER — IBUPROFEN 800 MG PO TABS: 800.0000 mg | ORAL_TABLET | Freq: Three times a day (TID) | ORAL | 0 refills | Status: AC

## 2018-01-17 MED ORDER — OXYCODONE-ACETAMINOPHEN 5-325 MG PO TABS: 1.0000 | ORAL_TABLET | Freq: Four times a day (QID) | ORAL | 0 refills | Status: DC | PRN

## 2018-01-17 MED ORDER — TETANUS-DIPHTH-ACELL PERTUSSIS 5-2.5-18.5 LF-MCG/0.5 IM SUSP
0.5000 mL | Freq: Once | INTRAMUSCULAR | Status: AC
  Administered 2018-01-17: 0.5 mL via INTRAMUSCULAR
  Filled 2018-01-17: qty 0.5

## 2018-01-17 MED ORDER — IBUPROFEN 800 MG PO TABS: 800.0000 mg | ORAL_TABLET | Freq: Three times a day (TID) | ORAL | 0 refills | Status: DC

## 2018-01-17 MED ORDER — LIDOCAINE HCL (PF) 1 % IJ SOLN
5.0000 mL | Freq: Once | INTRAMUSCULAR | Status: AC
  Administered 2018-01-17: 5 mL
  Filled 2018-01-17: qty 5

## 2018-01-17 MED ORDER — OXYCODONE-ACETAMINOPHEN 5-325 MG PO TABS
1.0000 | ORAL_TABLET | Freq: Once | ORAL | Status: AC
  Administered 2018-01-17: 1 via ORAL
  Filled 2018-01-17: qty 1

## 2018-01-17 MED ORDER — DOXYCYCLINE HYCLATE 100 MG PO CAPS: 100.0000 mg | ORAL_CAPSULE | Freq: Two times a day (BID) | ORAL | 0 refills | Status: DC

## 2018-01-17 MED ORDER — CEFAZOLIN SODIUM-DEXTROSE 2-4 GM/100ML-% IV SOLN
2.0000 g | Freq: Once | INTRAVENOUS | Status: AC
  Administered 2018-01-17: 2 g via INTRAVENOUS
  Filled 2018-01-17: qty 100

## 2018-01-17 NOTE — Discharge Instructions (Addendum)
You were seen in the emergency department today for an injury to your left second finger.  It appears that you have a broken bone in the area of the laceration.  We placed 8 stitches into the laceration.  We applied a dressing and a splint to this area.  We would like you to remain in the splint and dressing until you see Dr. Fredna Dow, the hand surgeon, on Monday.  Please call his office to make an appointment that day.  In the meantime we are sending you home with multiple prescriptions: -Doxycycline-this is an antibiotic, please take this once in the morning and once in the evening.  Please complete your antibiotic course unless otherwise directed by Dr. Fredna Dow. -Ibuprofen 800 mg, take this every 8 hours as needed for pain, take this with food as it can cause stomach upset and or stomach bleeding.  Do not take other NSAIDs such as Motrin, Aleve, Advil, or Mobic with this as they are similar medicines. -Percocet-take this every 6 hours as needed for severe pain.  This has Tylenol in it, be aware of this you do not exceed the maximum dose of Tylenol in 24 hours.  Please keep this out of reach of small children.  Do not drive or operate heavy machinery when you take this medicine.  Do not drink alcohol with this medicine.  Be aware that this medicine has addicting qualities.  We have prescribed you new medication(s) today. Discuss the medications prescribed today with your pharmacist as they can have adverse effects and interactions with your other medicines including over the counter and prescribed medications. Seek medical evaluation if you start to experience new or abnormal symptoms after taking one of these medicines, seek care immediately if you start to experience difficulty breathing, feeling of your throat closing, facial swelling, or rash as these could be indications of a more serious allergic reaction  Please follow-up with Dr. Fredna Dow as discussed on Monday.  Return to the ER anytime for new or  worsening symptoms including but not limited to fever, worsening pain, loss of sensation, redness spreading up your arm, or any other concerns.   Your prescriptions were accidentally sent to your mail in pharmacy, be sure to let them know that you do not need these as we have given you written prescriptions.

## 2018-01-17 NOTE — ED Provider Notes (Signed)
[facility] EMERGENCY DEPARTMENT Provider Note   CSN: [id number] Arrival date & time: 01/17/18  1408   History   Chief Complaint Chief Complaint  Patient presents with  . Finger Injury    HPI Isabella Rodgers is a 47 y.o. female history of hypertension, iron deficiency anemia, and tubal ligation who presents to the emergency department status post L 2nd digit injury which occurred at 1400 today. Patient states that she was utilizing electric clippers and accidentally cut the distal aspect of the L 2nd digit. She held pressure, no other interventions PTA. She states that the area is painful, pain improved somewhat with percocet in triage, but is coming back now, no other specific alleviating/aggravating factors. Denies numbness, tingling, or weakness. Patient is R hand dominant. Her last tetanus was > 5 years ago.   HPI  Past Medical History:  Diagnosis Date  . Hypertension   . Iron deficiency anemia    hx--resolved after ablation  . Serum sodium elevated    hx of  . Unspecified vitamin D deficiency     Patient Active Problem List   Diagnosis Date Noted  . Essential hypertension, benign 07/07/2012  . Vitamin D deficiency 07/07/2012    Past Surgical History:  Procedure Laterality Date  . CYST EXCISION     left shoulder and left leg  . ENDOMETRIAL ABLATION  09/28/2013  . TUBAL LIGATION       OB History    Gravida  4   Para  2   Term      Preterm      AB  2   Living  2     SAB      TAB  2   Ectopic      Multiple      Live Births               Home Medications    Prior to Admission medications   Medication Sig Start Date End Date Taking? Authorizing Provider  amLODipine (NORVASC) 5 MG tablet TAKE 1 AND 1/2 TABLETS     (7.5MG  TOTAL) DAILY 12/30/17   Rita Ohara, MD  Cholecalciferol (VITAMIN D) 2000 UNITS CAPS Take 1 capsule by mouth daily. Reported on 11/23/2015    [provider]  ferrous sulfate 325 (65 FE) MG tablet Take 325  mg by mouth daily with breakfast.    [provider]  ibuprofen (ADVIL,MOTRIN) 800 MG tablet Take 1 tablet (800 mg total) by mouth every 8 (eight) hours as needed. Patient not taking: Reported on 12/30/2017 05/30/17   Girtha Rm, NP-C    Family History Family History  Problem Relation Age of Onset  . Diabetes Mother   . Hyperlipidemia Mother   . Hypertension Mother   . Arthritis Mother   . Diabetes Father   . Hyperlipidemia Father   . Hypertension Father   . Kidney cancer Father 69  . Prostate cancer Father   . Cancer Father   . Kidney failure Father        on dialysis  . Stomach cancer Brother 39       smoker  . Coronary artery disease Maternal Aunt        died at 65  . Diabetes Maternal Grandmother   . Arthritis Maternal Grandmother   . Coronary artery disease Maternal Grandfather        50's  . Diabetes Maternal Grandfather   . Diabetes Paternal Grandmother   . Cancer Paternal Grandfather  prostate  . Diabetes Paternal Grandfather   . Diabetes Maternal Uncle     Social History Social History   Tobacco Use  . Smoking status: Never Smoker  . Smokeless tobacco: Never Used  Substance Use Topics  . Alcohol use: No    Alcohol/week: 0.0 oz  . Drug use: No     Allergies   Patient has no known allergies.   Review of Systems Review of Systems  Constitutional: Negative for chills and fever.  Musculoskeletal:       Positive for L 2nd digit pain  Skin: Positive for wound.  Neurological: Negative for weakness and numbness.  All other systems reviewed and are negative.    Physical Exam Updated Vital Signs BP (!) 126/92 (BP Location: Right Arm)   Pulse 65   Temp 98.4 F (36.9 C) (Oral)   Resp 16   LMP 12/24/2017   SpO2 100%   Physical Exam  Constitutional: She appears well-developed and well-nourished. No distress.  HENT:  Head: Normocephalic and atraumatic.  Eyes: Conjunctivae are normal. Right eye exhibits no discharge. Left eye  exhibits no discharge.  Cardiovascular:  Pulses:      Radial pulses are 2+ on the right side, and 2+ on the left side.  Musculoskeletal:  Upper extremities: Patient has normal ROM to all MCPs and IP joints with the exception of some limitation in L DIP joint flexion secondary to pain however she is able to perform this motion somewhat, she is able to fully extend at this joint. She is tender to palpation over the L 2nd digit distal phalanx and DIP joint. Upper extremities otherwise nontender. No snuffbox tenderness.   Neurological: She is alert.  Clear speech. Sensation grossly intact to bilateral upper extremities. Grip strength not assessed secondary to pain.   Skin: Capillary refill takes less than 2 seconds.  There is a complex approximately 2.5 cm laceration to the palmar surface of the left 2nd finger over the distal phalanx, laceration does not extend to the IP joint. Laceration does extend laterally, but not all the way to the dorsum of the digit. No nail bed involvement. Mild bleeding controlled with pressure, no appreciable FBs. NVI distally with brisk cap refill.   Psychiatric: She has a normal mood and affect. Her behavior is normal. Thought content normal.  Nursing note and vitals reviewed.         ED Treatments / Results  Labs (all labs ordered are listed, but only abnormal results are displayed) Labs Reviewed - No data to display  EKG None  Radiology Dg Finger Index Left  Result Date: 01/17/2018 CLINICAL DATA:  Laceration to right-sided finger. EXAM: LEFT INDEX FINGER 2+V COMPARISON:  None. FINDINGS: There is a fracture in the volar proximal aspect of the distal phalanx, extending into the interphalangeal joint. Minimal displacement. Overlying gauze limits further evaluation but no foreign bodies identified. IMPRESSION: There is a fracture through the volar surface of the proximal aspect of the distal phalanx. The fracture extends into the interphalangeal joint.  Electronically Signed   By: Dorise Bullion III M.D   On: 01/17/2018 15:42    Procedures .[facility] KitchenLaceration Repair Date/Time: 01/17/2018 6:55 PM Performed by: Amaryllis Dyke, PA-C Authorized by: Amaryllis Dyke, PA-C   Consent:    Consent obtained:  Verbal   Consent given by:  Patient   Risks discussed:  Infection, pain, retained foreign body, tendon damage, vascular damage, poor wound healing, poor cosmetic result, nerve damage and need for additional repair (  Also discussed loss of the digit secondary to this injury long term)   Alternatives discussed:  No treatment Anesthesia (see MAR for exact dosages):    Anesthesia method:  Nerve block   Block needle gauge:  27 G   Block anesthetic:  Lidocaine 1% w/o epi   Block outcome:  Anesthesia achieved Laceration details:    Location:  Finger   Finger location:  L index finger   Length (cm):  2.5 Repair type:    Repair type:  Simple Pre-procedure details:    Preparation:  Patient was prepped and draped in usual sterile fashion and imaging obtained to evaluate for foreign bodies Exploration:    Hemostasis achieved with:  Direct pressure   Wound exploration: wound explored through full range of motion and entire depth of wound probed and visualized   Treatment:    Area cleansed with:  Betadine   Amount of cleaning:  Extensive   Irrigation solution:  Sterile water   Irrigation method:  Pressure wash Skin repair:    Repair method:  Sutures   Suture size:  4-0   Suture material:  Nylon   Suture technique:  Simple interrupted   Number of sutures:  8 Post-procedure details:    Dressing:  Antibiotic ointment, non-adherent dressing and splint for protection   Patient tolerance of procedure:  Tolerated well, no immediate complications  .Splint Application Date/Time: 3/71/6967 7:09 PM Performed by: EDT Authorized by: Amaryllis Dyke, PA-C   Consent:    Consent obtained:  Verbal   Consent given by:  Patient   Risks  discussed:  Discoloration, numbness, pain and swelling   Alternatives discussed:  No treatment Pre-procedure details:    Sensation:  Normal Procedure details:    Laterality:  Left   Location:  Finger   Finger:  L index finger   Splint type: L 2nd digit aluminum splint. Post-procedure details:    Sensation:  Normal   Patient tolerance of procedure:  Tolerated well, no immediate complications (including critical care time)  Medications Ordered in ED Medications  oxyCODONE-acetaminophen (PERCOCET/ROXICET) 5-325 MG per tablet 1 tablet (1 tablet Oral Given 01/17/18 1438)  oxyCODONE-acetaminophen (PERCOCET/ROXICET) 5-325 MG per tablet 1 tablet (has no administration in time range)  lidocaine (PF) (XYLOCAINE) 1 % injection 5 mL (5 mLs Infiltration Given 01/17/18 1739)  Tdap (BOOSTRIX) injection 0.5 mL (0.5 mLs Intramuscular Given 01/17/18 1739)  ceFAZolin (ANCEF) IVPB 2g/100 mL premix (0 g Intravenous Stopped 01/17/18 1906)     Initial Impression / Assessment and Plan / ED Course  I have reviewed the triage vital signs and the nursing notes.  Pertinent labs & imaging results that were available during my care of the patient were reviewed by me and considered in my medical decision making (see chart for details).   Patient presents with open fracture to L 2nd finger distal phalanx. X-ray confirmed fracture through the volar surface of the proximal aspect of the distal phalanx. The fracture extends into the interphalangeal joint. There is an overlying complex skin flap type laceration to the palmar surface of the distal phalanx. NVI distally with brisk capillary refill.   Discussed with hand surgeon on call Dr. Fredna Dow- in agreement for IV Ancef with laceration repair and splinting in the ED. Discharge home on doxycycline with follow up in clinic on Monday.   Pressure irrigation performed. Wound explored and base of wound visualized in a bloodless field without evidence of foreign body. Laceration  loosely approximated, abx ointment, non adhesive dressing,  and splint. Tdap updated. Doxycycline, ibuprofen, and percocet prescriptions given. [place] Controlled Substance reporting System queried. I discussed results, treatment plan, need for close hand surgery follow-up, and return precautions with the patient. Provided opportunity for questions, patient confirmed understanding and is in agreement with plan.   Final Clinical Impressions(s) / ED Diagnoses   Final diagnoses:  Open nondisplaced fracture of distal phalanx of left index finger, initial encounter    ED Discharge Orders        Ordered    doxycycline (VIBRAMYCIN) 100 MG capsule  2 times daily     01/17/18 1904    oxyCODONE-acetaminophen (PERCOCET/ROXICET) 5-325 MG tablet  Every 6 hours PRN     01/17/18 1904    ibuprofen (ADVIL,MOTRIN) 800 MG tablet  3 times daily     01/17/18 [address] [place] 01/17/18 1910    Pattricia Boss, MD 01/17/18 2321

## 2018-01-17 NOTE — ED Notes (Addendum)
Pt reports she was using an Associate Professor trimmers when she cut her left index finger. Bleeding controlled at this time.

## 2018-01-17 NOTE — ED Triage Notes (Signed)
Patient was using Chief of Staff when she cut her left index finger. Hemorrhage controlled. Wound rinsed with saline, wrapped in gauze. Last tetanus unknown.

## 2018-02-03 ENCOUNTER — Other Ambulatory Visit: Payer: Self-pay | Admitting: Family Medicine

## 2018-02-03 ENCOUNTER — Ambulatory Visit
Admission: RE | Admit: 2018-02-03 | Discharge: 2018-02-03 | Disposition: A | Discharge status: Home / Self Care | Payer: BC Managed Care – PPO | Source: Ambulatory Visit | Attending: Family Medicine | Admitting: Family Medicine

## 2018-02-03 DIAGNOSIS — Z1231 Encounter for screening mammogram for malignant neoplasm of breast: Secondary | ICD-10-CM

## 2018-12-30 NOTE — Progress Notes (Signed)
Chief Complaint  Patient presents with  . Annual Exam    fasting annual exam with pelvic exam. Sees eye doctor regularly. She does mention that she has gas (which is new). Also having hot flashes.     Isabella Rodgers is a 48 y.o. female who presents for a complete physical.  She has the following concerns:  She notices she is passing more gas (not odorous, but happening frequently, when walking).  No bloating or abdominal pain.   Eating a lot more since her daughter is home (she cooks well!). Having more spinach, cheese, lots of changes in her diet.  She hasn't had a period yet this year, and started having hot flashes.  They used to be worse, now is just sporadic.  Night sweats are improved.  Hypertension: She is compliant with taking the amlodipine 7.5mg  daily.Her BP's had been running 118-131/80-96 (usually low-mid 80's; had a headache the day it was 96). Denies any dizziness, edema (only if eats salty foods), chest pain, palpitations. Denies side effects to medications. headaches are infrequent.  Vitamin D deficiency: Her last level was 34.7 last year, while taking 2000 IU daily (was low at 24 in the past). She is currently taking 1000 IU, for the last 8 months, since they didn't have the 2000 IU dose available at the store; meant to take two, but admits to only taking 1.  H/o Iron deficiency anemia--had improved s/p ablation. Still had periods s/p ablation, but no longer heavy and no cramping. Hasn't a cycle yet this year. Denies dizziness, fatigue.  She stopped taking iron last year, after hemoglobin was normal at 13.1. Had 3-4 cycles since then.  Does like to chew ice.  Immunization History  Administered Date(s) Administered  . Influenza, Seasonal, Injecte, Preservative Fre 07/07/2012  . Influenza,inj,Quad PF,6+ Mos 04/15/2014, 04/13/2015  . PPD Test 02/10/2016  . Tdap 09/17/2012, 01/17/2018   Last Pap smear: 11/2016 normal, no high risk HPV Last mammogram: 01/2018 Last  colonoscopy: never Last DEXA: never Dentist: twice yearly Ophtho:yearly (wears contacts), due Exercise: None over the last 3 months (routine is "shot" since the pandemic).  She bought a bike, rode in the neighborhood with her daughter for the first week only.  Swims some laps in the pool. Prior routine had been: 20 minutes of Zumba daily at a minimum, up to 40 mins if her schedule allows (with hand weights, and sometimes ankle weights). Lipids: Lab Results  Component Value Date   CHOL 159 12/30/2017   HDL 46 12/30/2017   LDLCALC 99 12/30/2017   TRIG 69 12/30/2017   CHOLHDL 3.5 12/30/2017   Past Medical History:  Diagnosis Date  . Hypertension   . Iron deficiency anemia    hx--resolved after ablation  . Serum sodium elevated    hx of  . Unspecified vitamin D deficiency     Past Surgical History:  Procedure Laterality Date  . CYST EXCISION     left shoulder and left leg  . ENDOMETRIAL ABLATION  09/28/2013  . TUBAL LIGATION      Social History   Socioeconomic History  . Marital status: Married    Spouse name: Not on file  . Number of children: 2  . Years of education: Not on file  . Highest education level: Not on file  Occupational History  . Occupation: Manufacturing engineer Group    Employer: [employer]  Social Needs  . Financial resource strain: Not on file  . Food insecurity    Worry:  Not on file    Inability: Not on file  . Transportation needs    Medical: Not on file    Non-medical: Not on file  Tobacco Use  . Smoking status: Never Smoker  . Smokeless tobacco: Never Used  Substance and Sexual Activity  . Alcohol use: No    Alcohol/week: 0.0 standard drinks  . Drug use: No  . Sexual activity: Yes    Partners: Male    Birth control/protection: Surgical  Lifestyle  . Physical activity    Days per week: Not on file    Minutes per session: Not on file  . Stress: Not on file  Relationships  . Social Herbalist on phone: Not on file     Gets together: Not on file    Attends religious service: Not on file    Active member of club or organization: Not on file    Attends meetings of clubs or organizations: Not on file    Relationship status: Not on file  . Intimate partner violence    Fear of current or ex partner: Not on file    Emotionally abused: Not on file    Physically abused: Not on file    Forced sexual activity: Not on file  Other Topics Concern  . Not on file  Social History Narrative   Lives with her husband, and youngest daughter [name]); oldest daughter graduated college  (Health and safety inspector school); has a job in [employer].   Divorced 07/2012; Remarried 01/2013.   Left [facility] in May 2017.   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   Husband is a Administrator (only home on the weekends)    Family History  Problem Relation Age of Onset  . Diabetes Mother   . Hyperlipidemia Mother   . Hypertension Mother   . Arthritis Mother   . Diabetes Father   . Hyperlipidemia Father   . Hypertension Father   . Kidney cancer Father 67  . Prostate cancer Father   . Cancer Father   . Kidney failure Father        on dialysis  . Dementia Father   . Stomach cancer Brother 43       smoker  . Coronary artery disease Maternal Aunt        died at 6  . Diabetes Maternal Grandmother   . Arthritis Maternal Grandmother   . Coronary artery disease Maternal Grandfather        50's  . Diabetes Maternal Grandfather   . Diabetes Paternal Grandmother   . Cancer Paternal Grandfather        prostate  . Diabetes Paternal Grandfather   . Diabetes Maternal Uncle     Outpatient Encounter Medications as of 01/01/2019  Medication Sig  . amLODipine (NORVASC) 5 MG tablet TAKE 1 AND 1/2 TABLETS     (7.5MG  TOTAL) DAILY  . cholecalciferol (VITAMIN D3) 25 MCG (1000 UT) tablet Take 1,000 Units by mouth daily.  .  [DISCONTINUED] amLODipine (NORVASC) 5 MG tablet TAKE 1 AND 1/2 TABLETS     (7.5MG  TOTAL) DAILY  . ibuprofen (ADVIL,MOTRIN) 800 MG tablet Take 1 tablet (800 mg total) by mouth 3 (three) times daily. (Patient not taking: Reported on 01/01/2019)  . [DISCONTINUED] Cholecalciferol (VITAMIN D) 2000 UNITS CAPS Take 1 capsule by mouth daily. Reported on 11/23/2015  . [DISCONTINUED] doxycycline (VIBRAMYCIN) 100 MG capsule Take  1 capsule (100 mg total) by mouth 2 (two) times daily.  . [DISCONTINUED] ferrous sulfate 325 (65 FE) MG tablet Take 325 mg by mouth daily with breakfast.  . [DISCONTINUED] oxyCODONE-acetaminophen (PERCOCET/ROXICET) 5-325 MG tablet Take 1-2 tablets by mouth every 6 (six) hours as needed for severe pain.   No facility-administered encounter medications on file as of 01/01/2019.     No Known Allergies   ROS: The patient denies anorexia, fever, vision changes, decreased hearing, ear pain, sore throat, breast concerns, chest pain, palpitations, dizziness, syncope, dyspnea on exertion, cough, swelling, nausea, vomiting, diarrhea, constipation, abdominal pain, melena, hematochezia, indigestion/heartburn, hematuria, incontinence, dysuria, vaginal discharge, odor or itch, genital lesions, joint pains, numbness, tingling, weakness, tremor, suspicious skin lesions, depression, anxiety, abnormal bleeding/bruising, or enlarged lymph nodes. Rare headaches (related to stress) Irregular menses per HPI Some snoring, per husband, noapnea. Doesn't feel tired. Gas and hot flashes per HPI. +weight gain  PHYSICAL EXAM:  BP 124/80   Pulse 80   Temp (!) 96.9 F (36.1 C) (Tympanic)   Ht 5' 8.5" (1.74 m)   Wt 221 lb 6.4 oz (100.4 kg)   BMI 33.17 kg/m   Wt Readings from Last 3 Encounters:  01/01/19 221 lb 6.4 oz (100.4 kg)  12/30/17 204 lb (92.5 kg)  05/30/17 215 lb (97.5 kg)    General Appearance:   Alert, cooperative, no distress, appears stated age  Head:   Normocephalic, without  obvious abnormality, atraumatic  Eyes:   PERRL, conjunctiva/corneas clear, EOM's intact, fundi benign  Ears:   Normal TM's and external ear canals  Nose:  Not examined (wearing mask due to COVID-19 pandemic)  Throat:  Not examined (wearing mask due to COVID-19 pandemic)  Neck:  Supple, no lymphadenopathy; thyroid: no enlargement/ tenderness/nodules; no carotid bruit or JVD  Back:  Spine nontender, no curvature, ROM normal, no CVA tenderness  Lungs:   Clear to auscultation bilaterally without wheezes, rales orronchi; respirations unlabored  Chest Wall:   No tenderness or deformity  Heart:   Regular rate and rhythm, S1 and S2 normal, no murmur, rubor gallop  Breast Exam:  No nipple inversion, discharge, skin dimpling, masses or axillary lymphadenopathy  Abdomen:   Soft, non-tender, nondistended, normoactive bowel sounds, no masses, no hepatosplenomegaly  Genitalia:   normal external genitalia, BUS and vagina normal. No abnormal vaginal discharge. No cervical motion tenderness. Uterus and adnexa normal, nontender, no mass. Pap not performed  Rectal: Normal sphincter tone, no mass, heme negative stool  Extremities:  No clubbing, cyanosis or edema.  Pulses:  2+ and symmetric all extremities  Skin:  Skin color, texture, turgor normal, no rashes or lesions  Lymph nodes:  Cervical, supraclavicular, and axillary nodes normal  Neurologic:  CNII-XII intact, normal strength, sensation and gait; reflexes 2+ and symmetric throughout  Psych: Normal mood, affect, hygiene and grooming   ASSESSMENT/PLAN:  Annual physical exam - Plan: CBC with Differential/Platelet, VITAMIN D 25 Hydroxy (Vit-D Deficiency, Fractures), TSH, Comprehensive metabolic panel  Essential hypertension, benign - Plan: Comprehensive metabolic panel, amLODipine (NORVASC) 5 MG tablet  Vitamin D deficiency - dose reduced to 1000 IU; check level to see if  sufficient, previously required 2000 IU daily - Plan: VITAMIN D 25 Hydroxy (Vit-D Deficiency, Fractures)  Weight gain - suspect related to significant changes in diet and less exercise. Risks reviewed at length, encouraged wt loss - Plan: TSH  Irregular menses - suspect perimenopausal; sx are tolerable - Plan: TSH  Essential hypertension, benign - well controlled - Plan:  Comprehensive metabolic panel, amLODipine (NORVASC) 5 MG tablet  Obesity BMI 33--counseled extensively  Gas--reviewed potential causes, including dietary changes, whether it be the vegetables vs dairy.  Counseled re: lactaid or Beano prn.   Discussed monthly self breast exams and yearly mammograms (past due, reminded to schedule); at least 30 minutes of aerobic activity at least 5 days/week, weight bearing exercise 2x/wk; proper sunscreen use reviewed; healthy diet, including goals of calcium and vitamin D intake and alcohol recommendations (less than or equal to 1 drink/day) reviewed; regular seatbelt use; changing batteries in smoke detectors. Immunization recommendations discussed--continue yearlyflu shots, Shingrix age 89. Colonoscopy recommendations reviewed, age 31, (sooner prn or if/when guidelines/insurance coverage changes)   F/u 1 year, sooner prn

## 2018-12-30 NOTE — Patient Instructions (Addendum)
HEALTH MAINTENANCE RECOMMENDATIONS:  It is recommended that you get at least 30 minutes of aerobic exercise at least 5 days/week (for weight loss, you may need as much as 60-90 minutes). This can be any activity that gets your heart rate up. This can be divided in 10-15 minute intervals if needed, but try and build up your endurance at least once a week.  Weight bearing exercise is also recommended twice weekly.  Eat a healthy diet with lots of vegetables, fruits and fiber.  "Colorful" foods have a lot of vitamins (ie green vegetables, tomatoes, red peppers, etc).  Limit sweet tea, regular sodas and alcoholic beverages, all of which has a lot of calories and sugar.  Up to 1 alcoholic drink daily may be beneficial for women (unless trying to lose weight, watch sugars).  Drink a lot of water.  Calcium recommendations are 1200-1500 mg daily (1500 mg for postmenopausal women or women without ovaries), and vitamin D 1000 IU daily.  This should be obtained from diet and/or supplements (vitamins), and calcium should not be taken all at once, but in divided doses.  Monthly self breast exams and yearly mammograms for women over the age of 28 is recommended.  Sunscreen of at least SPF 30 should be used on all sun-exposed parts of the skin when outside between the hours of 10 am and 4 pm (not just when at beach or pool, but even with exercise, golf, tennis, and yard work!)  Use a sunscreen that says "broad spectrum" so it covers both UVA and UVB rays, and make sure to reapply every 1-2 hours.  Remember to change the batteries in your smoke detectors when changing your clock times in the spring and fall. Carbon monoxide detectors are recommended for your home.  Use your seat belt every time you are in a car, and please drive safely and not be distracted with cell phones and texting while driving.  Try and pay attention to your diet and see which foods may be triggering your increased gas--dairy, versus  vegetables (brocolli, spinach, beans, etc).  If it is dairy, you can try dairy-free diet, vs taking Lactaid tablet prior to a meal with cheese.  If it is more related to the vegetables, you can try taking Beano prior to the meal.  Check with your insurance to see if they cover routine screening colonoscopy at age 57.   MyPlate from [place] is an outline of a general healthy diet based on the 2010 Dietary Guidelines for Americans, from the U.S. Department of Agriculture Scientist, research (physical sciences)). It sets guidelines for how much food you should eat from each food group based on your age, sex, and level of physical activity. What are tips for following MyPlate? To follow MyPlate recommendations:  Eat a wide variety of fruits and vegetables, grains, and protein foods.  Serve smaller portions and eat less food throughout the day.  Limit portion sizes to avoid overeating.  Enjoy your food.  Get at least 150 minutes of exercise every week. This is about 30 minutes each day, 5 or more days per week. It can be difficult to have every meal look like MyPlate. Think about MyPlate as eating guidelines for an entire day, rather than each individual meal. Fruits and vegetables  Make half of your plate fruits and vegetables.  Eat many different colors of fruits and vegetables each day.  For a 2,000 calorie daily food plan, eat: ? 2 cups of vegetables every day. ? 2 cups of  fruit every day.  1 cup is equal to: ? 1 cup raw or cooked vegetables. ? 1 cup raw fruit. ? 1 medium-sized orange, apple, or banana. ? 1 cup 100% fruit or vegetable juice. ? 2 cups raw leafy greens, such as lettuce, spinach, or kale. ?  cup dried fruit. Grains  One fourth of your plate should be grains.  Make at least half of the grains you eat each day whole grains.  For a 2,000 calorie daily food plan, eat 6 oz of grains every day.  1 oz is equal to: ? 1 slice bread. ? 1 cup cereal. ?  cup cooked rice, cereal, or pasta.  Protein  One fourth of your plate should be protein.  Eat a wide variety of protein foods, including meat, poultry, fish, eggs, beans, nuts, and tofu.  For a 2,000 calorie daily food plan, eat 5 oz of protein every day.  1 oz is equal to: ? 1 oz meat, poultry, or fish. ?  cup cooked beans. ? 1 egg. ?  oz nuts or seeds. ? 1 Tbsp peanut butter. Dairy  Drink fat-free or low-fat (1%) milk.  Eat or drink dairy as a side to meals.  For a 2,000 calorie daily food plan, eat or drink 3 cups of dairy every day.  1 cup is equal to: ? 1 cup milk, yogurt, cottage cheese, or soy milk (soy beverage). ? 2 oz processed cheese. ? 1 oz natural cheese. Fats, oils, salt, and sugars  Only small amounts of oils are recommended.  Avoid foods that are high in calories and low in nutritional value (empty calories), like foods high in fat or added sugars.  Choose foods that are low in salt (sodium). Choose foods that have less than 140 milligrams (mg) of sodium per serving.  Drink water instead of sugary drinks. Drink enough water each day to keep your urine pale yellow. Where to find support  Work with your health care provider or a nutrition specialist (dietitian) to develop a customized eating plan that is right for you.  Download an app (mobile application) to help you track your daily food intake. Where to find more information  Go to [email or web address] for more information.  Learn more and log your daily food intake according to MyPlate using USDA's SuperTracker: www.supertracker.usda.gov Summary  MyPlate is a general guideline for healthy eating from the USDA. It is based on the 2010 Dietary Guidelines for Americans.  In general, fruits and vegetables should take up  of your plate, grains should take up  of your plate, and protein should take up  of your plate. This information is not intended to replace advice given to you by your health care provider. Make sure you discuss any  questions you have with your health care provider. Document Released: 07/01/2007 Document Revised: 07/13/2017 Document Reviewed: 09/10/2016 Elsevier Patient Education  [place].

## 2019-01-01 ENCOUNTER — Other Ambulatory Visit: Payer: Self-pay

## 2019-01-01 ENCOUNTER — Ambulatory Visit (INDEPENDENT_AMBULATORY_CARE_PROVIDER_SITE_OTHER): Payer: BC Managed Care – PPO | Admitting: Family Medicine

## 2019-01-01 ENCOUNTER — Encounter: Payer: Self-pay | Admitting: Family Medicine

## 2019-01-01 VITALS — BP 124/80 | HR 80 | Temp 96.9°F | Ht 68.5 in | Wt 221.4 lb

## 2019-01-01 DIAGNOSIS — Z6833 Body mass index (BMI) 33.0-33.9, adult: Secondary | ICD-10-CM

## 2019-01-01 DIAGNOSIS — E559 Vitamin D deficiency, unspecified: Secondary | ICD-10-CM | POA: Diagnosis not present

## 2019-01-01 DIAGNOSIS — N926 Irregular menstruation, unspecified: Secondary | ICD-10-CM | POA: Diagnosis not present

## 2019-01-01 DIAGNOSIS — Z0000 Encounter for general adult medical examination without abnormal findings: Secondary | ICD-10-CM | POA: Diagnosis not present

## 2019-01-01 DIAGNOSIS — E6609 Other obesity due to excess calories: Secondary | ICD-10-CM

## 2019-01-01 DIAGNOSIS — I10 Essential (primary) hypertension: Secondary | ICD-10-CM

## 2019-01-01 DIAGNOSIS — R635 Abnormal weight gain: Secondary | ICD-10-CM | POA: Diagnosis not present

## 2019-01-01 DIAGNOSIS — R140 Abdominal distension (gaseous): Secondary | ICD-10-CM

## 2019-01-01 MED ORDER — AMLODIPINE BESYLATE 5 MG PO TABS: ORAL_TABLET | ORAL | 3 refills | Status: DC

## 2019-01-02 LAB — CBC WITH DIFFERENTIAL/PLATELET
Basophils Absolute: 0 10*3/uL (ref 0.0–0.2)
Basos: 0 %
EOS (ABSOLUTE): 0.2 10*3/uL (ref 0.0–0.4)
Eos: 3 %
Hematocrit: 40.9 % (ref 34.0–46.6)
Hemoglobin: 13.6 g/dL (ref 11.1–15.9)
Immature Grans (Abs): 0 10*3/uL (ref 0.0–0.1)
Immature Granulocytes: 0 %
Lymphocytes Absolute: 3.2 10*3/uL — ABNORMAL HIGH (ref 0.7–3.1)
Lymphs: 45 %
MCH: 28 pg (ref 26.6–33.0)
MCHC: 33.3 g/dL (ref 31.5–35.7)
MCV: 84 fL (ref 79–97)
Monocytes Absolute: 0.4 10*3/uL (ref 0.1–0.9)
Monocytes: 6 %
Neutrophils Absolute: 3.4 10*3/uL (ref 1.4–7.0)
Neutrophils: 46 %
Platelets: 231 10*3/uL (ref 150–450)
RBC: 4.86 x10E6/uL (ref 3.77–5.28)
RDW: 12.2 % (ref 11.7–15.4)
WBC: 7.2 10*3/uL (ref 3.4–10.8)

## 2019-01-02 LAB — COMPREHENSIVE METABOLIC PANEL
ALT: 14 IU/L (ref 0–32)
AST: 18 IU/L (ref 0–40)
Albumin/Globulin Ratio: 1.6 (ref 1.2–2.2)
Albumin: 4.6 g/dL (ref 3.8–4.8)
Alkaline Phosphatase: 84 IU/L (ref 39–117)
BUN/Creatinine Ratio: 19 (ref 9–23)
BUN: 15 mg/dL (ref 6–24)
Bilirubin Total: 0.3 mg/dL (ref 0.0–1.2)
CO2: 22 mmol/L (ref 20–29)
Calcium: 9 mg/dL (ref 8.7–10.2)
Chloride: 102 mmol/L (ref 96–106)
Creatinine, Ser: 0.78 mg/dL (ref 0.57–1.00)
GFR calc Af Amer: 106 mL/min/{1.73_m2} (ref >59)
GFR calc non Af Amer: 92 mL/min/{1.73_m2} (ref >59)
Globulin, Total: 2.9 g/dL (ref 1.5–4.5)
Glucose: 81 mg/dL (ref 65–99)
Potassium: 4.5 mmol/L (ref 3.5–5.2)
Sodium: 140 mmol/L (ref 134–144)
Total Protein: 7.5 g/dL (ref 6.0–8.5)

## 2019-01-02 LAB — TSH: TSH: 1.96 u[IU]/mL (ref 0.450–4.500)

## 2019-01-02 LAB — VITAMIN D 25 HYDROXY (VIT D DEFICIENCY, FRACTURES): Vit D, 25-Hydroxy: 30.4 ng/mL (ref 30.0–100.0)

## 2019-01-05 ENCOUNTER — Other Ambulatory Visit: Payer: Self-pay

## 2019-01-05 ENCOUNTER — Other Ambulatory Visit: Payer: BC Managed Care – PPO

## 2019-01-05 DIAGNOSIS — Z20822 Contact with and (suspected) exposure to covid-19: Secondary | ICD-10-CM

## 2019-01-09 LAB — NOVEL CORONAVIRUS, NAA: SARS-CoV-2, NAA: NOT DETECTED

## 2019-08-03 ENCOUNTER — Ambulatory Visit: Payer: BC Managed Care – PPO | Attending: Internal Medicine

## 2019-08-03 DIAGNOSIS — Z20822 Contact with and (suspected) exposure to covid-19: Secondary | ICD-10-CM

## 2019-08-04 LAB — NOVEL CORONAVIRUS, NAA: SARS-CoV-2, NAA: NOT DETECTED

## 2020-01-08 NOTE — Patient Instructions (Addendum)
  HEALTH MAINTENANCE RECOMMENDATIONS:  It is recommended that you get at least 30 minutes of aerobic exercise at least 5 days/week (for weight loss, you may need as much as 60-90 minutes). This can be any activity that gets your heart rate up. This can be divided in 10-15 minute intervals if needed, but try and build up your endurance at least once a week.  Weight bearing exercise is also recommended twice weekly.  Eat a healthy diet with lots of vegetables, fruits and fiber.  "Colorful" foods have a lot of vitamins (ie green vegetables, tomatoes, red peppers, etc).  Limit sweet tea, regular sodas and alcoholic beverages, all of which has a lot of calories and sugar.  Up to 1 alcoholic drink daily may be beneficial for women (unless trying to lose weight, watch sugars).  Drink a lot of water.  Calcium recommendations are 1200-1500 mg daily (1500 mg for postmenopausal women or women without ovaries), and vitamin D 1000 IU daily.  This should be obtained from diet and/or supplements (vitamins), and calcium should not be taken all at once, but in divided doses.  Monthly self breast exams and yearly mammograms for women over the age of 21 is recommended.  Sunscreen of at least SPF 30 should be used on all sun-exposed parts of the skin when outside between the hours of 10 am and 4 pm (not just when at beach or pool, but even with exercise, golf, tennis, and yard work!)  Use a sunscreen that says "broad spectrum" so it covers both UVA and UVB rays, and make sure to reapply every 1-2 hours.  Remember to change the batteries in your smoke detectors when changing your clock times in the spring and fall. Carbon monoxide detectors are recommended for your home.  Use your seat belt every time you are in a car, and please drive safely and not be distracted with cell phones and texting while driving.  Do exercises to "warm up" prior to walking, and do the stretches at the end. Core exercises and stretching of the  back can help prevent back pain.  Consider yoga.  It is also possible that your mattress may be due to be replaced.  Please call and schedule your mammogram. We are referring you to [facility] for routine colon cancer screening with colonoscopy.

## 2020-01-08 NOTE — Progress Notes (Signed)
Chief Complaint  Patient presents with  . Annual Exam    fasting annual exam with pap. Did not have a cycle for 18 months and then had a regular cycle July 9th. (4 days) Now is having a daily slight brownish discharge. LBP on both sides for a couple of weeks. No other concerns.     Isabella Rodgers is a 49 y.o. female who presents for a complete physical.  She has the following concerns:  She has been having some LBP for a couple of weeks. She wakes up feeling stiff, improves as the day goes on.  Woke up one day with discomfort, and has persisted every morning.  Feels better once she moves around.  Mattress is 47 years old. No known trauma or change in activity.  Can't recall if she had done something prior to the onset. Hasn't done any exercises or stretches for her back (just legs, prior to walking). Denies any numbness, tingling or radiation of the pain.  Hypertension: She is compliant with taking the amlodipine 7.14m daily.Her BP's had been running around 120/70-85, sometimes higher when has a headache/stressed (max 126/90).  Denies any dizziness, edema (notices at ankle if eats salty foods, or if walks without her compression socks), chest pain, palpitations. Denies side effects to medications. headaches are infrequent, mainly when irritated by someone.  Vitamin D deficiency: Her last level was 30.4 last year, when taking 1000 IU daily.  She was advised to increase to 2000 IU over the winter months.  Had been a little higher (34.7) when taking 2000 IU in the past. She is currently taking 2000  IU daily.  H/o Iron deficiency anemia--had improved s/p ablation. Still had periods s/p ablation, but no longerheavy and no cramping. She stopped taking iron a couple of years ago, and didn't have any recurrent anemia. Went 18 months without any cycle, until 7/9. Then had a regular period.   For a while she was having some hot flashes and night sweats, but not lately. She also reports wanting  ice chips again.  She no longer takes iron. Lab Results  Component Value Date   WBC 7.2 01/01/2019   HGB 13.6 01/01/2019   HCT 40.9 01/01/2019   MCV 84 01/01/2019   PLT 231 01/01/2019    Immunization History  Administered Date(s) Administered  . Influenza, Seasonal, Injecte, Preservative Fre 07/07/2012  . Influenza,inj,Quad PF,6+ Mos 04/15/2014, 04/13/2015  . PFIZER SARS-COV-2 Vaccination 08/27/2019, 09/18/2019  . PPD Test 02/10/2016  . Tdap 09/17/2012, 01/17/2018   Gets flu shots yearly Last Pap smear: 11/2016 normal, no high risk HPV Last mammogram: 01/2018 Last colonoscopy: never Last DEXA: never Dentist: twice yearly Ophtho:yearly(wears contacts) Exercise:Walking 3-5 miles at least 5 days/week.  Does some [place] (no weights, but some bodyweight exercises, like mountain-climbers); has some ankle weights and dumbbells at home.  Lipids: Lab Results  Component Value Date   CHOL 159 12/30/2017   HDL 46 12/30/2017   LDLCALC 99 12/30/2017   TRIG 69 12/30/2017   CHOLHDL 3.5 12/30/2017   PMH, PSH, SH and FH reviewed  Outpatient Encounter Medications as of 01/11/2020  Medication Sig  . amLODipine (NORVASC) 5 MG tablet TAKE 1 AND 1/2 TABLETS     (7.5MG TOTAL) DAILY  . cholecalciferol (VITAMIN D3) 25 MCG (1000 UT) tablet Take 2,000 Units by mouth daily.   .Marland Kitchenibuprofen (ADVIL,MOTRIN) 800 MG tablet Take 1 tablet (800 mg total) by mouth 3 (three) times daily. (Patient not taking: Reported on 01/01/2019)  No facility-administered encounter medications on file as of 01/11/2020.   No Known Allergies  ROS: The patient denies anorexia, fever, vision changes, decreased hearing, ear pain, sore throat, breast concerns, chest pain, palpitations, dizziness, syncope, dyspnea on exertion, cough, swelling, nausea, vomiting, diarrhea, constipation, abdominal pain, melena, hematochezia, indigestion/heartburn, hematuria, incontinence, dysuria, vaginal discharge, odor or itch, genital lesions,  joint pains, numbness, tingling, weakness, tremor, suspicious skin lesions, depression, anxiety, abnormal bleeding/bruising, or enlarged lymph nodes. Rare headaches (related to stress/aggravation) Irregular menses per HPI. Hot flashes/night sweats--resolved LBP/stiffness in the mornings, per HPI Reports she had lost down to 189# (eating healthy, 2 green smoothies daily and 1 meal); due to sick family members and recent deaths, she has been driving a lot and needing to eat more fast food while driving, regained some weight.   PHYSICAL EXAM:  BP 120/74   Pulse 72   Ht _0  (1.753 m)   Wt 208 lb 9.6 oz (94.6 kg)   BMI 30.80 kg/m   Wt Readings from Last 3 Encounters:  01/11/20 208 lb 9.6 oz (94.6 kg)  01/01/19 221 lb 6.4 oz (100.4 kg)  12/30/17 204 lb (92.5 kg)    General Appearance:   Alert, cooperative, no distress, appears stated age  Head:   Normocephalic, without obvious abnormality, atraumatic  Eyes:   PERRL, conjunctiva/corneas clear, EOM's intact, fundi benign  Ears:   Normal TM's and external ear canals  Nose:  Not examined (wearing mask due to COVID-19 pandemic)  Throat:  Not examined (wearing mask due to COVID-19 pandemic)  Neck:  Supple, no lymphadenopathy; thyroid: no enlargement/ tenderness/nodules; no carotid bruit or JVD  Back:  Spine nontender, no curvature, ROM normal, no CVA tenderness  Lungs:   Clear to auscultation bilaterally without wheezes, rales orronchi; respirations unlabored  Chest Wall:   No tenderness or deformity  Heart:   Regular rate and rhythm, S1 and S2 normal, no murmur, rubor gallop  Breast Exam:  No nipple inversion, discharge, skin dimpling, masses or axillary lymphadenopathy  Abdomen:   Soft, non-tender, nondistended, normoactive bowel sounds, no masses, no hepatosplenomegaly  Genitalia:  normal external genitalia, BUS and vagina normal. Speculum exam performed--normal appearing cervix with no  bleeding or discharge.  There is a small amount of light brown discharge in the vault. No odor. No cervical motion tenderness. Uterus and adnexa normal, nontender, no mass. Pap notperformed  Rectal: Normal sphincter tone, no mass, heme negative stool  Extremities:  No clubbing, cyanosis or edema.  Pulses:  2+ and symmetric all extremities  Skin:  Skin color, texture, turgor normal, no rashes or lesions  Lymph nodes:  Cervical, supraclavicular, and axillary nodes normal  Neurologic:  CNII-XII intact, normal strength, sensation and gait; reflexes 2+ and symmetric throughout  Psych: Normal mood, affect, hygiene and grooming    ASSESSMENT/PLAN:  Annual physical exam - Plan: POCT Urinalysis DIP (Proadvantage Device), Comprehensive metabolic panel, CBC with Differential/Platelet, VITAMIN D 25 Hydroxy (Vit-D Deficiency, Fractures), TSH, Follicle Stimulating Hormone  Essential hypertension, benign - well controlled; cont amlodipine, daily exercise, low Na diet and wt loss - Plan: amLODipine (NORVASC) 5 MG tablet, Comprehensive metabolic panel  Vitamin D deficiency - due for recheck, now on 2000 IU daily, suspect should be fine. - Plan: VITAMIN D 25 Hydroxy (Vit-D Deficiency, Fractures)  Colon cancer screening - prefers colonoscopy - Plan: Ambulatory referral to Gastroenterology  Irregular menses - perimenopausal, s/p ablation (but never stopped cycles).  If Beatrice is elevated (postmenopausal), refer to GYN for bleeding. -  Plan: CBC with Differential/Platelet, TSH, Follicle Stimulating Hormone  Bilateral low back pain without sciatica, unspecified chronicity - encouraged core strengthening, stretches. May need new mattress. No need for imaging or meds at this time, f/u if worse   c-met, D, TSH, CBC, FSH   Discussed monthly self breast exams and yearly mammograms(past due, reminded to schedule); at least 30 minutes of aerobic activity at least 5 days/week,  weight bearing exercise 2x/wk; proper sunscreen use reviewed; healthy diet, including goals of calcium and vitamin D intake and alcohol recommendations (less than or equal to 1 drink/day) reviewed; regular seatbelt use; changing batteries in smoke detectors. Immunization recommendations discussed--continueyearlyflu shots, Shingrix age 72. Colon cancer screening is now due, options discussed. Referred for colonoscopy. Healthy diet reviewed, weight loss encouraged.  F/u 1 year, sooner prn

## 2020-01-11 ENCOUNTER — Encounter: Payer: Self-pay | Admitting: Family Medicine

## 2020-01-11 ENCOUNTER — Ambulatory Visit: Payer: BC Managed Care – PPO | Admitting: Family Medicine

## 2020-01-11 ENCOUNTER — Encounter: Payer: Self-pay | Admitting: Gastroenterology

## 2020-01-11 VITALS — BP 120/74 | HR 72 | Ht 69.0 in | Wt 208.6 lb

## 2020-01-11 DIAGNOSIS — Z1211 Encounter for screening for malignant neoplasm of colon: Secondary | ICD-10-CM

## 2020-01-11 DIAGNOSIS — N926 Irregular menstruation, unspecified: Secondary | ICD-10-CM

## 2020-01-11 DIAGNOSIS — I10 Essential (primary) hypertension: Secondary | ICD-10-CM

## 2020-01-11 DIAGNOSIS — E559 Vitamin D deficiency, unspecified: Secondary | ICD-10-CM

## 2020-01-11 DIAGNOSIS — Z0000 Encounter for general adult medical examination without abnormal findings: Secondary | ICD-10-CM | POA: Diagnosis not present

## 2020-01-11 DIAGNOSIS — M545 Low back pain, unspecified: Secondary | ICD-10-CM

## 2020-01-11 LAB — POCT URINALYSIS DIP (PROADVANTAGE DEVICE)
Bilirubin, UA: NEGATIVE
Blood, UA: NEGATIVE
Glucose, UA: NEGATIVE mg/dL
Ketones, POC UA: NEGATIVE mg/dL
Leukocytes, UA: NEGATIVE
Nitrite, UA: NEGATIVE
Protein Ur, POC: NEGATIVE mg/dL
Specific Gravity, Urine: 1.025
Urobilinogen, Ur: NEGATIVE
pH, UA: 6 (ref 5.0–8.0)

## 2020-01-11 MED ORDER — AMLODIPINE BESYLATE 5 MG PO TABS: ORAL_TABLET | ORAL | 3 refills | Status: DC

## 2020-01-12 LAB — CBC WITH DIFFERENTIAL/PLATELET
Basophils Absolute: 0 10*3/uL (ref 0.0–0.2)
Basos: 0 %
EOS (ABSOLUTE): 0.2 10*3/uL (ref 0.0–0.4)
Eos: 3 %
Hematocrit: 42 % (ref 34.0–46.6)
Hemoglobin: 13.8 g/dL (ref 11.1–15.9)
Immature Grans (Abs): 0 10*3/uL (ref 0.0–0.1)
Immature Granulocytes: 0 %
Lymphocytes Absolute: 3.3 10*3/uL — ABNORMAL HIGH (ref 0.7–3.1)
Lymphs: 48 %
MCH: 28.6 pg (ref 26.6–33.0)
MCHC: 32.9 g/dL (ref 31.5–35.7)
MCV: 87 fL (ref 79–97)
Monocytes Absolute: 0.4 10*3/uL (ref 0.1–0.9)
Monocytes: 5 %
Neutrophils Absolute: 3 10*3/uL (ref 1.4–7.0)
Neutrophils: 44 %
Platelets: 274 10*3/uL (ref 150–450)
RBC: 4.82 x10E6/uL (ref 3.77–5.28)
RDW: 12.4 % (ref 11.7–15.4)
WBC: 6.9 10*3/uL (ref 3.4–10.8)

## 2020-01-12 LAB — COMPREHENSIVE METABOLIC PANEL
ALT: 19 IU/L (ref 0–32)
AST: 16 IU/L (ref 0–40)
Albumin/Globulin Ratio: 1.6 (ref 1.2–2.2)
Albumin: 4.6 g/dL (ref 3.8–4.8)
Alkaline Phosphatase: 78 IU/L (ref 48–121)
BUN/Creatinine Ratio: 19 (ref 9–23)
BUN: 16 mg/dL (ref 6–24)
Bilirubin Total: 0.3 mg/dL (ref 0.0–1.2)
CO2: 24 mmol/L (ref 20–29)
Calcium: 9.1 mg/dL (ref 8.7–10.2)
Chloride: 103 mmol/L (ref 96–106)
Creatinine, Ser: 0.84 mg/dL (ref 0.57–1.00)
GFR calc Af Amer: 96 mL/min/{1.73_m2} (ref >59)
GFR calc non Af Amer: 84 mL/min/{1.73_m2} (ref >59)
Globulin, Total: 2.9 g/dL (ref 1.5–4.5)
Glucose: 86 mg/dL (ref 65–99)
Potassium: 4.5 mmol/L (ref 3.5–5.2)
Sodium: 139 mmol/L (ref 134–144)
Total Protein: 7.5 g/dL (ref 6.0–8.5)

## 2020-01-12 LAB — VITAMIN D 25 HYDROXY (VIT D DEFICIENCY, FRACTURES): Vit D, 25-Hydroxy: 34.7 ng/mL (ref 30.0–100.0)

## 2020-01-12 LAB — TSH: TSH: 1.82 u[IU]/mL (ref 0.450–4.500)

## 2020-01-12 LAB — FOLLICLE STIMULATING HORMONE: FSH: 19 m[IU]/mL

## 2020-01-18 ENCOUNTER — Other Ambulatory Visit: Payer: Self-pay | Admitting: Family Medicine

## 2020-01-18 DIAGNOSIS — Z1231 Encounter for screening mammogram for malignant neoplasm of breast: Secondary | ICD-10-CM

## 2020-02-02 ENCOUNTER — Ambulatory Visit
Admission: RE | Admit: 2020-02-02 | Discharge: 2020-02-02 | Disposition: A | Discharge status: Home / Self Care | Payer: BC Managed Care – PPO | Source: Ambulatory Visit | Attending: Family Medicine | Admitting: Family Medicine

## 2020-02-02 ENCOUNTER — Other Ambulatory Visit: Payer: Self-pay

## 2020-02-02 DIAGNOSIS — Z1231 Encounter for screening mammogram for malignant neoplasm of breast: Secondary | ICD-10-CM

## 2020-02-04 ENCOUNTER — Other Ambulatory Visit: Payer: Self-pay | Admitting: Family Medicine

## 2020-02-04 DIAGNOSIS — R928 Other abnormal and inconclusive findings on diagnostic imaging of breast: Secondary | ICD-10-CM

## 2020-02-24 ENCOUNTER — Ambulatory Visit
Admission: RE | Admit: 2020-02-24 | Discharge: 2020-02-24 | Disposition: A | Discharge status: Home / Self Care | Payer: BC Managed Care – PPO | Source: Ambulatory Visit | Attending: Family Medicine | Admitting: Family Medicine

## 2020-02-24 ENCOUNTER — Other Ambulatory Visit: Payer: Self-pay

## 2020-02-24 ENCOUNTER — Other Ambulatory Visit: Payer: Self-pay | Admitting: Family Medicine

## 2020-02-24 DIAGNOSIS — R928 Other abnormal and inconclusive findings on diagnostic imaging of breast: Secondary | ICD-10-CM

## 2020-02-24 DIAGNOSIS — N6320 Unspecified lump in the left breast, unspecified quadrant: Secondary | ICD-10-CM

## 2020-03-14 ENCOUNTER — Encounter: Payer: BC Managed Care – PPO | Admitting: Gastroenterology

## 2020-04-18 ENCOUNTER — Encounter: Payer: Self-pay | Admitting: Gastroenterology

## 2020-04-18 ENCOUNTER — Other Ambulatory Visit: Payer: Self-pay

## 2020-04-18 ENCOUNTER — Ambulatory Visit (AMBULATORY_SURGERY_CENTER): Payer: Self-pay | Admitting: *Deleted

## 2020-04-18 VITALS — Ht 69.0 in | Wt 212.0 lb

## 2020-04-18 DIAGNOSIS — Z1211 Encounter for screening for malignant neoplasm of colon: Secondary | ICD-10-CM

## 2020-04-18 MED ORDER — PLENVU 140 G PO SOLR: 1.0000 | ORAL | 0 refills | Status: DC

## 2020-04-18 NOTE — Progress Notes (Signed)
cov vax x 2 No egg or soy allergy known to patient  No issues with past sedation with any surgeries or procedures no intubation problems in the past  No FH of Malignant Hyperthermia No diet pills per patient No home 02 use per patient  No blood thinners per patient  Pt denies issues with constipation  No A fib or A flutter  EMMI video to pt or via [place] 19 guidelines implemented in [place] today with Pt and RN   Plenvu  Coupon given to pt in [facility] today , Code to Pharmacy   Due to the COVID-19 pandemic we are asking patients to follow these guidelines. Please only bring one care partner. Please be aware that your care partner may wait in the car in the parking lot or if they feel like they will be too hot to wait in the car, they may wait in the lobby on the 4th floor. All care partners are required to wear a mask the entire time (we do not have any that we can provide them), they need to practice social distancing, and we will do a Covid check for all patient's and care partners when you arrive. Also we will check their temperature and your temperature. If the care partner waits in their car they need to stay in the parking lot the entire time and we will call them on their cell phone when the patient is ready for discharge so they can bring the car to the front of the building. Also all patient's will need to wear a mask into building.

## 2020-05-02 ENCOUNTER — Encounter: Payer: BC Managed Care – PPO | Admitting: Gastroenterology

## 2020-05-05 ENCOUNTER — Ambulatory Visit (AMBULATORY_SURGERY_CENTER): Payer: BC Managed Care – PPO | Admitting: Gastroenterology

## 2020-05-05 ENCOUNTER — Other Ambulatory Visit: Payer: Self-pay

## 2020-05-05 ENCOUNTER — Encounter: Payer: Self-pay | Admitting: Gastroenterology

## 2020-05-05 VITALS — BP 138/72 | HR 80 | Temp 96.6°F | Resp 15 | Ht 69.0 in | Wt 212.0 lb

## 2020-05-05 DIAGNOSIS — Z1211 Encounter for screening for malignant neoplasm of colon: Secondary | ICD-10-CM | POA: Diagnosis not present

## 2020-05-05 DIAGNOSIS — D123 Benign neoplasm of transverse colon: Secondary | ICD-10-CM | POA: Diagnosis not present

## 2020-05-05 MED ORDER — SODIUM CHLORIDE 0.9 % IV SOLN: 500.0000 mL | Freq: Once | INTRAVENOUS | Status: DC

## 2020-05-05 NOTE — Patient Instructions (Signed)
Read all of the handouts given to you by your recovery room nurse. Thank-you for choosing [place] for your healthcare needs today.  YOU HAD AN ENDOSCOPIC PROCEDURE TODAY AT [facility]:   Refer to the procedure report that was given to you for any specific questions about what was found during the examination.  If the procedure report does not answer your questions, please call your gastroenterologist to clarify.  If you requested that your care partner not be given the details of your procedure findings, then the procedure report has been included in a sealed envelope for you to review at your convenience later.  YOU SHOULD EXPECT: Some feelings of bloating in the abdomen. Passage of more gas than usual.  Walking can help get rid of the air that was put into your GI tract during the procedure and reduce the bloating. If you had a lower endoscopy (such as a colonoscopy or flexible sigmoidoscopy) you may notice spotting of blood in your stool or on the toilet paper. If you underwent a bowel prep for your procedure, you may not have a normal bowel movement for a few days.  Please Note:  You might notice some irritation and congestion in your nose or some drainage.  This is from the oxygen used during your procedure.  There is no need for concern and it should clear up in a day or so.  SYMPTOMS TO REPORT IMMEDIATELY:   Following lower endoscopy (colonoscopy or flexible sigmoidoscopy):  Excessive amounts of blood in the stool  Significant tenderness or worsening of abdominal pains  Swelling of the abdomen that is new, acute  Fever of 100F or higher   For urgent or emergent issues, a gastroenterologist can be reached at any hour by calling [phone]. Do not use MyChart messaging for urgent concerns.    DIET:  We do recommend a small meal at first, but then you may proceed to your regular diet.  Drink plenty of fluids but you should avoid alcoholic beverages for 24 hours. Try to  increase the fiber in your diet, and drink plenty of water.  ACTIVITY:  You should plan to take it easy for the rest of today and you should NOT DRIVE or use heavy machinery until tomorrow (because of the sedation medicines used during the test).    FOLLOW UP: Our staff will call the number listed on your records 48-72 hours following your procedure to check on you and address any questions or concerns that you may have regarding the information given to you following your procedure. If we do not reach you, we will leave a message.  We will attempt to reach you two times.  During this call, we will ask if you have developed any symptoms of COVID 19. If you develop any symptoms (ie: fever, flu-like symptoms, shortness of breath, cough etc.) before then, please call [phone].  If you test positive for Covid 19 in the 2 weeks post procedure, please call and report this information to [place].    If any biopsies were taken you will be contacted by phone or by letter within the next 1-3 weeks.  Please call us at [phone] if you have not heard about the biopsies in 3 weeks.    SIGNATURES/CONFIDENTIALITY: You and/or your care partner have signed paperwork which will be entered into your electronic medical record.  These signatures attest to the fact that that the information above on your After Visit Summary has been reviewed and  is understood.  Full responsibility of the confidentiality of this discharge information lies with you and/or your care-partner.

## 2020-05-05 NOTE — Progress Notes (Signed)
Pt Drowsy. VSS. To PACU, report to RN. No anesthetic complications noted.  

## 2020-05-05 NOTE — Progress Notes (Signed)
Called to room to assist during endoscopic procedure.  Patient ID and intended procedure confirmed with present staff. Received instructions for my participation in the procedure from the performing physician.  

## 2020-05-05 NOTE — Progress Notes (Signed)
VS by FD  Pt's states no medical or surgical changes since previsit or office visit.

## 2020-05-05 NOTE — Op Note (Signed)
[facility] Patient Name: Isabella Rodgers Procedure Date: 05/05/2020 3:49 PM MRN: [id number] Endoscopist: Remo Lipps P. Havery Moros , MD Age: 49 Referring MD:  Date of Birth: 1971/04/08 Gender: Female Account #: [id number] Procedure:                Colonoscopy Indications:              Screening for colorectal malignant neoplasm, This                            is the patient's first colonoscopy Medicines:                Monitored Anesthesia Care Procedure:                Pre-Anesthesia Assessment:                           - Prior to the procedure, a History and Physical                            was performed, and patient medications and                            allergies were reviewed. The patient's tolerance of                            previous anesthesia was also reviewed. The risks                            and benefits of the procedure and the sedation                            options and risks were discussed with the patient.                            All questions were answered, and informed consent                            was obtained. Prior Anticoagulants: The patient has                            taken no previous anticoagulant or antiplatelet                            agents. ASA Grade Assessment: II - A patient with                            mild systemic disease. After reviewing the risks                            and benefits, the patient was deemed in                            satisfactory condition to undergo the procedure.  After obtaining informed consent, the colonoscope                            was passed under direct vision. Throughout the                            procedure, the patient's blood pressure, pulse, and                            oxygen saturations were monitored continuously. The                            Colonoscope was introduced through the anus and                            advanced to the the  terminal ileum, with                            identification of the appendiceal orifice and IC                            valve. The colonoscopy was performed without                            difficulty. The patient tolerated the procedure                            well. The quality of the bowel preparation was                            good. The terminal ileum, ileocecal valve,                            appendiceal orifice, and rectum were photographed. Scope In: 3:59:49 PM Scope Out: 4:17:57 PM Scope Withdrawal Time: 0 hours 13 minutes 26 seconds  Total Procedure Duration: 0 hours 18 minutes 8 seconds  Findings:                 The perianal and digital rectal examinations were                            normal.                           The terminal ileum appeared normal.                           A 4 to 5 mm polyp was found in the hepatic flexure.                            The polyp was sessile. The polyp was removed with a                            cold snare. Resection and retrieval were complete.  A few small-mouthed diverticula were found in the                            ascending colon.                           Internal hemorrhoids were found during retroflexion.                           The exam was otherwise without abnormality. Complications:            No immediate complications. Estimated blood loss:                            Minimal. Estimated Blood Loss:     Estimated blood loss was minimal. Impression:               - The examined portion of the ileum was normal.                           - One 4 to 5 mm polyp at the hepatic flexure,                            removed with a cold snare. Resected and retrieved.                           - Diverticulosis in the ascending colon.                           - Internal hemorrhoids.                           - The examination was otherwise normal. Recommendation:           - Patient has a  contact number available for                            emergencies. The signs and symptoms of potential                            delayed complications were discussed with the                            patient. Return to normal activities tomorrow.                            Written discharge instructions were provided to the                            patient.                           - Resume previous diet.                           - Continue present medications.                           -  Await pathology results. Remo Lipps P. Yolinda Duerr, MD 05/05/2020 4:22:05 PM This report has been signed electronically.

## 2020-05-09 ENCOUNTER — Telehealth: Payer: Self-pay

## 2020-05-09 NOTE — Telephone Encounter (Signed)
  Follow up Call-  Call back number 05/05/2020  Post procedure Call Back phone  # [phone]  Permission to leave phone message Yes  Some recent data might be hidden     Patient questions:  Do you have a fever, pain , or abdominal swelling? No. Pain Score  0 *  Have you tolerated food without any problems? Yes.    Have you been able to return to your normal activities? Yes.    Do you have any questions about your discharge instructions: Diet   No. Medications  No. Follow up visit  No.  Do you have questions or concerns about your Care? No.  Actions: * If pain score is 4 or above: No action needed, pain <4.   1. Have you developed a fever since your procedure? no  2.   Have you had an respiratory symptoms (SOB or cough) since your procedure? no  3.   Have you tested positive for COVID 19 since your procedure no  4.   Have you had any family members/close contacts diagnosed with the COVID 19 since your procedure?  no   If yes to any of these questions please route to Joylene John, RN and Joella Prince, RN

## 2020-08-01 ENCOUNTER — Encounter: Payer: Self-pay | Admitting: Family Medicine

## 2020-08-01 ENCOUNTER — Other Ambulatory Visit: Payer: Self-pay

## 2020-08-01 ENCOUNTER — Ambulatory Visit: Payer: BC Managed Care – PPO | Admitting: Family Medicine

## 2020-08-01 VITALS — BP 124/84 | HR 74 | Ht 69.0 in | Wt 218.6 lb

## 2020-08-01 DIAGNOSIS — R079 Chest pain, unspecified: Secondary | ICD-10-CM | POA: Diagnosis not present

## 2020-08-01 DIAGNOSIS — I10 Essential (primary) hypertension: Secondary | ICD-10-CM | POA: Diagnosis not present

## 2020-08-01 NOTE — Progress Notes (Signed)
Chief Complaint  Patient presents with  . Chest Pain    And SOB started last night around 5:30pm-was very sharp and she could not catch her breath.    Acute onset of sharp pain at her left side of her chest, which radiated to the mid-sternum, started last evening.  She was at her computer when it started.  She grabbed the area, took deep breaths, it subsided, but recurred once she let go.  Holding pressure on chest helped, when released, it tightened up/hurt to let go.  Did this for about 2 hours. She took 3 ibuprofen and laid down. It felt better to lay on her chest. She woke up several times at night--increased pain when she sat up or was on her side.  Deep breaths helped. This morning pain was about the same, less tight than last night, but still present.  Denies any heartburn/indigestion. Husband thinks it is gas--she had been passing a lot of gas the night prior. She recently started new diet (Optavia).  Bowels are normal, well controlled with her magnesium No change in activity or any injury.  This morning, while talking a lot, felt like she needed to "catch her breath" more Denies any pain with activity or SOB.  No inactivity, no calf pain or swelling, no travel No recent COVID  PMH, PSH, SH reviewed  Outpatient Encounter Medications as of 08/01/2020  Medication Sig Note  . amLODipine (NORVASC) 5 MG tablet TAKE 1 AND 1/2 TABLETS     (7.5MG  TOTAL) DAILY   . cholecalciferol (VITAMIN D3) 25 MCG (1000 UT) tablet Take 2,000 Units by mouth daily.    Marland Kitchen MAGNESIUM PO Take by mouth. occ use   . ibuprofen (ADVIL,MOTRIN) 800 MG tablet Take 1 tablet (800 mg total) by mouth 3 (three) times daily. (Patient not taking: No sig reported) 08/01/2020: Took 600mg  last night (3 tabs of 200mg  OTC dose)   No facility-administered encounter medications on file as of 08/01/2020.   No Known Allergies  ROS: no fever, chills, URI symptoms (runny nose if cold out, chronic), cough, wheezing, nausea,  vomiting, heartburn, bowel changes, urinary complaints. Denies headaches, dizziness, calf pain or swelling, bleeding, bruising, rash. See HPI  PHYSICAL EXAM:  BP 124/84   Pulse 74   Ht 5\' 9"  (1.753 m)   Wt 218 lb 9.6 oz (99.2 kg)   SpO2 97%   BMI 32.28 kg/m   Pleasant, somewhat anxious-appearing female, in no distress HEENT: conjunctiva and sclera are clear, EOMI, wearing mask Neck: no lymphadenopathy, thyromegaly, carotid bruit Heart: regular rate and rhythm, no murmur Chest wall: nontender along sternum or costochondral junction. She has focal, reproducible tenderness at left chest wall, just lateral to the CC junction, spanning about 3 levels. No palpable spasm or mass. Abdomen: soft, nontender, no organomegaly or mass Extremities: no edema.  Calves nontender Neuro: alert and oriented, normal strength, gait  EKG: NSR, no acute changes or e/o ischemia   ASSESSMENT/PLAN:  Chest pain, unspecified type - reproducible pain, normal EKG. Treat with ibuprofen 800mg  TID, warm compresses. Ddx reviewed--f/u if sx persist/worsen/change - Plan: EKG 12-Lead  Essential hypertension, benign - well controlled  Ddx reviewed with pt/spouse, as well as potential next steps if sx persist/worsen, to evaluate other possible diagnoses. Pain seems MSK based on nature (sudden onset, sharp) location (focal, reproducible on exam).  I spent 32 minutes dedicated to the care of this patient, including pre-visit review of records, face to face time, post-visit ordering of testing and documentation.

## 2020-08-26 ENCOUNTER — Ambulatory Visit
Admission: RE | Admit: 2020-08-26 | Discharge: 2020-08-26 | Disposition: A | Discharge status: Home / Self Care | Payer: BC Managed Care – PPO | Source: Ambulatory Visit | Attending: Family Medicine | Admitting: Family Medicine

## 2020-08-26 ENCOUNTER — Other Ambulatory Visit: Payer: Self-pay | Admitting: Family Medicine

## 2020-08-26 ENCOUNTER — Other Ambulatory Visit: Payer: Self-pay

## 2020-08-26 DIAGNOSIS — N6320 Unspecified lump in the left breast, unspecified quadrant: Secondary | ICD-10-CM

## 2020-11-24 ENCOUNTER — Encounter: Payer: Self-pay | Admitting: Family Medicine

## 2020-12-27 ENCOUNTER — Encounter (HOSPITAL_COMMUNITY): Payer: Self-pay | Admitting: Emergency Medicine

## 2020-12-27 ENCOUNTER — Other Ambulatory Visit: Payer: Self-pay

## 2020-12-27 ENCOUNTER — Emergency Department (HOSPITAL_COMMUNITY)
Admission: EM | Admit: 2020-12-27 | Discharge: 2020-12-28 | Disposition: A | Discharge status: Home / Self Care | Payer: BC Managed Care – PPO | Attending: Emergency Medicine | Admitting: Emergency Medicine

## 2020-12-27 ENCOUNTER — Emergency Department (HOSPITAL_COMMUNITY): Payer: BC Managed Care – PPO

## 2020-12-27 DIAGNOSIS — R52 Pain, unspecified: Secondary | ICD-10-CM

## 2020-12-27 DIAGNOSIS — Z79899 Other long term (current) drug therapy: Secondary | ICD-10-CM | POA: Insufficient documentation

## 2020-12-27 DIAGNOSIS — U071 COVID-19: Secondary | ICD-10-CM

## 2020-12-27 DIAGNOSIS — R000 Tachycardia, unspecified: Secondary | ICD-10-CM | POA: Diagnosis not present

## 2020-12-27 DIAGNOSIS — I10 Essential (primary) hypertension: Secondary | ICD-10-CM | POA: Insufficient documentation

## 2020-12-27 DIAGNOSIS — R509 Fever, unspecified: Secondary | ICD-10-CM | POA: Diagnosis present

## 2020-12-27 LAB — CBC WITH DIFFERENTIAL/PLATELET
Abs Immature Granulocytes: 0.03 10*3/uL (ref 0.00–0.07)
Basophils Absolute: 0 10*3/uL (ref 0.0–0.1)
Basophils Relative: 0 %
Eosinophils Absolute: 0.1 10*3/uL (ref 0.0–0.5)
Eosinophils Relative: 2 %
HCT: 40 % (ref 36.0–46.0)
Hemoglobin: 12.6 g/dL (ref 12.0–15.0)
Immature Granulocytes: 0 %
Lymphocytes Relative: 9 %
Lymphs Abs: 0.6 10*3/uL — ABNORMAL LOW (ref 0.7–4.0)
MCH: 27.8 pg (ref 26.0–34.0)
MCHC: 31.5 g/dL (ref 30.0–36.0)
MCV: 88.1 fL (ref 80.0–100.0)
Monocytes Absolute: 0.6 10*3/uL (ref 0.1–1.0)
Monocytes Relative: 9 %
Neutro Abs: 5.7 10*3/uL (ref 1.7–7.7)
Neutrophils Relative %: 80 %
Platelets: 224 10*3/uL (ref 150–400)
RBC: 4.54 MIL/uL (ref 3.87–5.11)
RDW: 12.9 % (ref 11.5–15.5)
WBC: 7.1 10*3/uL (ref 4.0–10.5)
nRBC: 0 % (ref 0.0–0.2)

## 2020-12-27 MED ORDER — ACETAMINOPHEN 500 MG PO TABS
1000.0000 mg | ORAL_TABLET | Freq: Once | ORAL | Status: AC
  Administered 2020-12-27: 23:00:00 1000 mg via ORAL
  Filled 2020-12-27: qty 2

## 2020-12-27 NOTE — ED Provider Notes (Signed)
Emergency Medicine Provider Triage Evaluation Note  Isabella Rodgers , a 50 y.o. female  was evaluated in triage.  Pt complains of chest tightness, bodyaches, fever and cough since 2am.  Unsure of sick contacts, traveled to a resort over the [place]th.  She has been vaccinated for covid.  Denies cardiac history.  Review of Systems  Positive: Chest tightness, cough, body aches Negative: Vomiting, diarrhea, abdominal pain  Physical Exam  BP (!) 158/95 (BP Location: Left Arm)   Pulse (!) 114   Temp (!) 100.7 F (38.2 C) (Oral)   Resp 18   SpO2 98%   Gen:   Awake, no distress   Resp:  Normal effort  MSK:   Moves extremities without difficulty  Other:  febrile  Medical Decision Making  Medically screening exam initiated at 11:18 PM.  Appropriate orders placed.  Isabella Rodgers was informed that the remainder of the evaluation will be completed by another provider, this initial triage assessment does not replace that evaluation, and the importance of remaining in the ED until their evaluation is complete.  Febrile to 100.8F.  given tylenol.  Will check labs, CXR, EKG, covid screen.   Larene Pickett, PA-C 12/27/20 2322    Fatima Blank, MD 12/28/20 [phone]

## 2020-12-27 NOTE — ED Triage Notes (Signed)
Pt c/o cough, fever, body aches, headaches, and chest pain.

## 2020-12-28 ENCOUNTER — Other Ambulatory Visit (HOSPITAL_COMMUNITY): Payer: Self-pay

## 2020-12-28 LAB — BASIC METABOLIC PANEL
Anion gap: 7 (ref 5–15)
BUN: 11 mg/dL (ref 6–20)
CO2: 22 mmol/L (ref 22–32)
Calcium: 8.5 mg/dL — ABNORMAL LOW (ref 8.9–10.3)
Chloride: 106 mmol/L (ref 98–111)
Creatinine, Ser: 0.73 mg/dL (ref 0.44–1.00)
GFR, Estimated: >60 mL/min (ref >60)
Glucose, Bld: 106 mg/dL — ABNORMAL HIGH (ref 70–99)
Potassium: 3.8 mmol/L (ref 3.5–5.1)
Sodium: 135 mmol/L (ref 135–145)

## 2020-12-28 LAB — RESP PANEL BY RT-PCR (FLU A&B, COVID) ARPGX2
Influenza A by PCR: NEGATIVE
Influenza B by PCR: NEGATIVE
SARS Coronavirus 2 by RT PCR: POSITIVE — AB

## 2020-12-28 LAB — TROPONIN I (HIGH SENSITIVITY)
Troponin I (High Sensitivity): 3 ng/L (ref <18)
Troponin I (High Sensitivity): 3 ng/L (ref <18)

## 2020-12-28 MED ORDER — KETOROLAC TROMETHAMINE 30 MG/ML IJ SOLN
30.0000 mg | Freq: Once | INTRAMUSCULAR | Status: AC
  Administered 2020-12-28: 09:00:00 30 mg via INTRAMUSCULAR
  Filled 2020-12-28: qty 1

## 2020-12-28 MED ORDER — BENZONATATE 100 MG PO CAPS
100.0000 mg | ORAL_CAPSULE | Freq: Three times a day (TID) | ORAL | 0 refills | Status: DC | PRN
  Filled 2020-12-28: qty 15, 5d supply, fill #0

## 2020-12-28 MED ORDER — MOLNUPIRAVIR EUA 200MG CAPSULE
4.0000 | ORAL_CAPSULE | Freq: Two times a day (BID) | ORAL | 0 refills | Status: AC
  Filled 2020-12-28: qty 40, 5d supply, fill #0

## 2020-12-28 NOTE — ED Provider Notes (Signed)
[facility] EMERGENCY DEPARTMENT Provider Note   CSN: [id number] Arrival date & time: 12/27/20  2257     History Chief Complaint  Patient presents with   Fever    Isabella Rodgers is a 50 y.o. female.  The history is provided by the patient.  Fever Isabella Rodgers is a 50 y.o. female who presents to the Emergency Department complaining of cough.  She presents to the ED complaining of cough, HA, sob, body aches and fever that started yesterday at 2am.  Has sore throat as well. She did recently travel on a plane.  No known sick contacts.   Has a hx/o HTN.  No hx/o DVT/PE.  No OCP.    Has been vaccinated for Covid 19 and boosted.       Past Medical History:  Diagnosis Date   Hypertension    Iron deficiency anemia    hx--resolved after ablation   Serum sodium elevated    hx of   Unspecified vitamin D deficiency     Patient Active Problem List   Diagnosis Date Noted   Essential hypertension, benign 07/07/2012   Vitamin D deficiency 07/07/2012    Past Surgical History:  Procedure Laterality Date   CYST EXCISION     left shoulder and left leg   ENDOMETRIAL ABLATION  09/28/2013   TUBAL LIGATION       OB History     Gravida  4   Para  2   Term      Preterm      AB  2   Living  2      SAB      IAB  2   Ectopic      Multiple      Live Births              Family History  Problem Relation Age of Onset   Diabetes Mother    Hyperlipidemia Mother    Hypertension Mother    Arthritis Mother    Diabetes Father    Hyperlipidemia Father    Hypertension Father    Kidney cancer Father 59   Prostate cancer Father    Cancer Father    Kidney failure Father        on dialysis   Dementia Father    Stomach cancer Brother 80       smoker   Coronary artery disease Maternal Aunt        died at 76   Diabetes Maternal Grandmother    Arthritis Maternal Grandmother    Coronary artery disease Maternal Grandfather        50's   Diabetes  Maternal Grandfather    Diabetes Paternal Grandmother    Cancer Paternal Grandfather        prostate   Diabetes Paternal Grandfather    Diabetes Maternal Uncle    Colon cancer Neg Hx    Colon polyps Neg Hx    Esophageal cancer Neg Hx    Rectal cancer Neg Hx     Social History   Tobacco Use   Smoking status: Never   Smokeless tobacco: Never  Vaping Use   Vaping Use: Never used  Substance Use Topics   Alcohol use: No    Alcohol/week: 0.0 standard drinks   Drug use: No    Home Medications Prior to Admission medications   Medication Sig Start Date End Date Taking? Authorizing Provider  benzonatate (TESSALON) 100 MG capsule Take 1 capsule (100  mg total) by mouth 3 (three) times daily as needed for cough. 12/28/20  Yes Quintella Reichert, MD  molnupiravir EUA 200 mg CAPS Take 4 capsules (800 mg total) by mouth 2 (two) times daily for 5 days. 12/28/20 01/02/21 Yes Quintella Reichert, MD  amLODipine (NORVASC) 5 MG tablet TAKE 1 AND 1/2 TABLETS     (7.5MG  TOTAL) DAILY 01/11/20   Rita Ohara, MD  cholecalciferol (VITAMIN D3) 25 MCG (1000 UT) tablet Take 2,000 Units by mouth daily.     [provider]  ibuprofen (ADVIL,MOTRIN) 800 MG tablet Take 1 tablet (800 mg total) by mouth 3 (three) times daily. Patient not taking: No sig reported 01/17/18   Petrucelli, Samantha R, PA-C  MAGNESIUM PO Take by mouth. occ use    [provider]    Allergies    Patient has no known allergies.  Review of Systems   Review of Systems  Constitutional:  Positive for fever.  All other systems reviewed and are negative.  Physical Exam Updated Vital Signs BP (!) 142/73   Pulse (!) 101   Temp 99.6 F (37.6 C) (Oral)   Resp 20   SpO2 99%   Physical Exam Vitals and nursing note reviewed.  Constitutional:      Appearance: She is well-developed.  HENT:     Head: Normocephalic and atraumatic.  Cardiovascular:     Rate and Rhythm: Regular rhythm. Tachycardia present.     Heart sounds: No  murmur heard. Pulmonary:     Effort: Pulmonary effort is normal. No respiratory distress.     Breath sounds: Normal breath sounds.  Abdominal:     Palpations: Abdomen is soft.     Tenderness: There is no abdominal tenderness. There is no guarding or rebound.  Musculoskeletal:        General: No swelling or tenderness.  Skin:    General: Skin is warm and dry.  Neurological:     Mental Status: She is alert and oriented to person, place, and time.  Psychiatric:        Behavior: Behavior normal.    ED Results / Procedures / Treatments   Labs (all labs ordered are listed, but only abnormal results are displayed) Labs Reviewed  RESP PANEL BY RT-PCR (FLU A&B, COVID) ARPGX2 - Abnormal; Notable for the following components:      Result Value   SARS Coronavirus 2 by RT PCR POSITIVE (*)    All other components within normal limits  CBC WITH DIFFERENTIAL/PLATELET - Abnormal; Notable for the following components:   Lymphs Abs 0.6 (*)    All other components within normal limits  BASIC METABOLIC PANEL - Abnormal; Notable for the following components:   Glucose, Bld 106 (*)    Calcium 8.5 (*)    All other components within normal limits  TROPONIN I (HIGH SENSITIVITY)  TROPONIN I (HIGH SENSITIVITY)    EKG EKG Interpretation  Date/Time:  Tuesday December 27 2020 23:25:31 EDT Ventricular Rate:  117 PR Interval:  132 QRS Duration: 74 QT Interval:  318 QTC Calculation: 443 R Axis:   11 Text Interpretation: Sinus tachycardia Cannot rule out Anterior infarct , age undetermined Abnormal ECG Confirmed by Quintella Reichert [phone]) on 12/28/2020 8:17:55 AM  Radiology DG Chest Port 1 View  Result Date: 12/27/2020 CLINICAL DATA:  Chest tightness and cough EXAM: PORTABLE CHEST 1 VIEW COMPARISON:  03/28/2016 FINDINGS: Bronchitic changes at the bases. No consolidation or effusion. Normal heart size. No pneumothorax. IMPRESSION: Mild bronchitic changes at the  bases.  No focal airspace disease.  Electronically Signed   By: Donavan Foil M.D.   On: 12/27/2020 23:32    Procedures Procedures   Medications Ordered in ED Medications  ketorolac (TORADOL) 30 MG/ML injection 30 mg (has no administration in time range)  acetaminophen (TYLENOL) tablet 1,000 mg (1,000 mg Oral Given 12/27/20 2327)    ED Course  I have reviewed the triage vital signs and the nursing notes.  Pertinent labs & imaging results that were available during my care of the patient were reviewed by me and considered in my medical decision making (see chart for details).    MDM Rules/Calculators/A&P                         patient here for evaluation of fever, cough, body aches. She is non-toxic appearing on evaluation with no respiratory distress. She is positive for COVID-19 today. She is on day two of symptoms and does qualify for antiviral treatment. Due to her taking amlodipine for hypertension will start her on molnupivir. Discussed home care, outpatient follow-up and return precautions.   Final Clinical Impression(s) / ED Diagnoses Final diagnoses:  COVID-19 virus infection  Generalized body aches    Rx / DC Orders ED Discharge Orders          Ordered    molnupiravir EUA 200 mg CAPS  2 times daily        12/28/20 0830    benzonatate (TESSALON) 100 MG capsule  3 times daily PRN        12/28/20 0830             Quintella Reichert, MD 12/28/20 [phone]

## 2021-01-12 ENCOUNTER — Encounter: Payer: BC Managed Care – PPO | Admitting: Family Medicine

## 2021-02-21 ENCOUNTER — Other Ambulatory Visit: Payer: Self-pay | Admitting: Family Medicine

## 2021-02-21 DIAGNOSIS — I10 Essential (primary) hypertension: Secondary | ICD-10-CM

## 2021-03-06 ENCOUNTER — Other Ambulatory Visit: Payer: BC Managed Care – PPO

## 2021-04-12 ENCOUNTER — Other Ambulatory Visit: Payer: Self-pay | Admitting: Family Medicine

## 2021-04-12 DIAGNOSIS — N6320 Unspecified lump in the left breast, unspecified quadrant: Secondary | ICD-10-CM

## 2021-04-12 NOTE — Patient Instructions (Addendum)
  HEALTH MAINTENANCE RECOMMENDATIONS:  It is recommended that you get at least 30 minutes of aerobic exercise at least 5 days/week (for weight loss, you may need as much as 60-90 minutes). This can be any activity that gets your heart rate up. This can be divided in 10-15 minute intervals if needed, but try and build up your endurance at least once a week.  Weight bearing exercise is also recommended twice weekly.  Eat a healthy diet with lots of vegetables, fruits and fiber.  "Colorful" foods have a lot of vitamins (ie green vegetables, tomatoes, red peppers, etc).  Limit sweet tea, regular sodas and alcoholic beverages, all of which has a lot of calories and sugar.  Up to 1 alcoholic drink daily may be beneficial for women (unless trying to lose weight, watch sugars).  Drink a lot of water.  Calcium recommendations are 1200-1500 mg daily (1500 mg for postmenopausal women or women without ovaries), and vitamin D 1000 IU daily.  This should be obtained from diet and/or supplements (vitamins), and calcium should not be taken all at once, but in divided doses.  Monthly self breast exams and yearly mammograms for women over the age of 45 is recommended.  Sunscreen of at least SPF 30 should be used on all sun-exposed parts of the skin when outside between the hours of 10 am and 4 pm (not just when at beach or pool, but even with exercise, golf, tennis, and yard work!)  Use a sunscreen that says "broad spectrum" so it covers both UVA and UVB rays, and make sure to reapply every 1-2 hours.  Remember to change the batteries in your smoke detectors when changing your clock times in the spring and fall. Carbon monoxide detectors are recommended for your home.  Use your seat belt every time you are in a car, and please drive safely and not be distracted with cell phones and texting while driving.  Your ankle wound is healing well. Use an antibacterial ointment until it heals completely.  Follow up if any  increase in pain, redness, swelling or fever develops.  Follow up (can be a virtual visit) if ongoing problems with stress/anxiety/anger.  Check with your employee assistance program for counseling.  Please resume home blood pressure monitoring. Goal is <130/80. If you want to wait and check this week to see if the diastolic (bottom number) remains >80 before increasing the amlodipine dose back to 1.5 tablets, you can. Send [place] a list of your blood pressures in the next 2-3 weeks, and indicate on the list your dose.
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
## 2021-04-13 ENCOUNTER — Encounter: Payer: Self-pay | Admitting: Family Medicine

## 2021-04-13 ENCOUNTER — Other Ambulatory Visit: Payer: Self-pay

## 2021-04-13 ENCOUNTER — Ambulatory Visit (INDEPENDENT_AMBULATORY_CARE_PROVIDER_SITE_OTHER): Payer: BC Managed Care – PPO | Admitting: Family Medicine

## 2021-04-13 VITALS — BP 130/94 | HR 80 | Ht 69.0 in | Wt 203.2 lb

## 2021-04-13 DIAGNOSIS — E010 Iodine-deficiency related diffuse (endemic) goiter: Secondary | ICD-10-CM

## 2021-04-13 DIAGNOSIS — S91011A Laceration without foreign body, right ankle, initial encounter: Secondary | ICD-10-CM

## 2021-04-13 DIAGNOSIS — E559 Vitamin D deficiency, unspecified: Secondary | ICD-10-CM

## 2021-04-13 DIAGNOSIS — Z23 Encounter for immunization: Secondary | ICD-10-CM

## 2021-04-13 DIAGNOSIS — Z0000 Encounter for general adult medical examination without abnormal findings: Secondary | ICD-10-CM | POA: Diagnosis not present

## 2021-04-13 DIAGNOSIS — I10 Essential (primary) hypertension: Secondary | ICD-10-CM | POA: Diagnosis not present

## 2021-04-13 DIAGNOSIS — Z113 Encounter for screening for infections with a predominantly sexual mode of transmission: Secondary | ICD-10-CM | POA: Diagnosis not present

## 2021-04-13 DIAGNOSIS — N841 Polyp of cervix uteri: Secondary | ICD-10-CM

## 2021-04-13 DIAGNOSIS — Z630 Problems in relationship with spouse or partner: Secondary | ICD-10-CM

## 2021-04-13 LAB — POCT URINALYSIS DIP (PROADVANTAGE DEVICE)
Blood, UA: NEGATIVE
Glucose, UA: NEGATIVE mg/dL
Leukocytes, UA: NEGATIVE
Nitrite, UA: NEGATIVE
Protein Ur, POC: 30 mg/dL — AB
Specific Gravity, Urine: 1.03
Urobilinogen, Ur: NEGATIVE
pH, UA: 6 (ref 5.0–8.0)

## 2021-04-14 LAB — LIPID PANEL
Chol/HDL Ratio: 3.7 ratio (ref 0.0–4.4)
Cholesterol, Total: 168 mg/dL (ref 100–199)
HDL: 45 mg/dL (ref >39)
LDL Chol Calc (NIH): 106 mg/dL — ABNORMAL HIGH (ref 0–99)
Triglycerides: 92 mg/dL (ref 0–149)
VLDL Cholesterol Cal: 17 mg/dL (ref 5–40)

## 2021-04-14 LAB — COMPREHENSIVE METABOLIC PANEL
ALT: 15 IU/L (ref 0–32)
AST: 18 IU/L (ref 0–40)
Albumin/Globulin Ratio: 1.8 (ref 1.2–2.2)
Albumin: 4.9 g/dL — ABNORMAL HIGH (ref 3.8–4.8)
Alkaline Phosphatase: 95 IU/L (ref 44–121)
BUN/Creatinine Ratio: 19 (ref 9–23)
BUN: 17 mg/dL (ref 6–24)
Bilirubin Total: 0.3 mg/dL (ref 0.0–1.2)
CO2: 22 mmol/L (ref 20–29)
Calcium: 9.8 mg/dL (ref 8.7–10.2)
Chloride: 103 mmol/L (ref 96–106)
Creatinine, Ser: 0.9 mg/dL (ref 0.57–1.00)
Globulin, Total: 2.8 g/dL (ref 1.5–4.5)
Glucose: 89 mg/dL (ref 70–99)
Potassium: 4.3 mmol/L (ref 3.5–5.2)
Sodium: 143 mmol/L (ref 134–144)
Total Protein: 7.7 g/dL (ref 6.0–8.5)
eGFR: 79 mL/min/{1.73_m2} (ref >59)

## 2021-04-14 LAB — TSH: TSH: 1.38 u[IU]/mL (ref 0.450–4.500)

## 2021-04-14 LAB — HEPATITIS B SURFACE ANTIGEN: Hepatitis B Surface Ag: NEGATIVE

## 2021-04-14 LAB — HEPATITIS C ANTIBODY: Hep C Virus Ab: <0.1 s/co ratio (ref 0.0–0.9)

## 2021-04-14 LAB — RPR: RPR Ser Ql: NONREACTIVE

## 2021-04-14 LAB — HIV ANTIBODY (ROUTINE TESTING W REFLEX): HIV Screen 4th Generation wRfx: NONREACTIVE

## 2021-04-15 LAB — GC/CHLAMYDIA PROBE AMP
Chlamydia trachomatis, NAA: NEGATIVE
Neisseria Gonorrhoeae by PCR: NEGATIVE

## 2021-04-17 ENCOUNTER — Encounter: Payer: Self-pay | Admitting: Family Medicine

## 2021-05-01 ENCOUNTER — Telehealth: Payer: Self-pay

## 2021-05-01 ENCOUNTER — Encounter: Payer: Self-pay | Admitting: Family Medicine

## 2021-05-01 NOTE — Telephone Encounter (Signed)
Called patient and she states she already has appointment with [facility] tomorrow

## 2021-05-01 NOTE — Telephone Encounter (Signed)
Patient needs appointment. Ok for 15 min.

## 2021-05-01 NOTE — Telephone Encounter (Signed)
Patient called to report to [place] that cut on left leg is red, swollen, and warm to touch since it was seen at last appointment on 04/13/21 and she was instructed to call back and let us know if worse

## 2021-05-02 ENCOUNTER — Ambulatory Visit
Admission: RE | Admit: 2021-05-02 | Discharge: 2021-05-02 | Disposition: A | Discharge status: Home / Self Care | Payer: BC Managed Care – PPO | Source: Ambulatory Visit | Attending: Medical | Admitting: Medical

## 2021-05-02 ENCOUNTER — Ambulatory Visit: Payer: BC Managed Care – PPO | Admitting: Medical

## 2021-05-02 ENCOUNTER — Other Ambulatory Visit: Payer: Self-pay

## 2021-05-02 VITALS — BP 130/90 | HR 100 | Temp 98.0°F | Wt 202.8 lb

## 2021-05-02 DIAGNOSIS — T148XXA Other injury of unspecified body region, initial encounter: Secondary | ICD-10-CM

## 2021-05-02 DIAGNOSIS — S81812D Laceration without foreign body, left lower leg, subsequent encounter: Secondary | ICD-10-CM

## 2021-05-02 MED ORDER — HYDROCODONE-ACETAMINOPHEN 5-325 MG PO TABS: 1.0000 | ORAL_TABLET | Freq: Four times a day (QID) | ORAL | 0 refills | Status: DC | PRN

## 2021-05-02 MED ORDER — AMOXICILLIN-POT CLAVULANATE 875-125 MG PO TABS: 1.0000 | ORAL_TABLET | Freq: Two times a day (BID) | ORAL | 0 refills | Status: DC

## 2021-05-02 NOTE — Patient Instructions (Signed)
Recommendations:  Please go to [facility] for your left lower leg xray.   Their hours are 8am - 4:30 pm Monday - Friday.  Take your insurance card with you.  [facility] [phone]  [facility] [address], [place], [place] [address]  [address], [place] [address]    Begin antibiotic Augmenting twice daily for 10 days  Use soap and water to clean leg  Keep wound clean and dry  Elevated the leg the next few days when possible and rest the leg  Begin Aleve over the counter twice daily for the next 3-5 days  You can use Norco hydrocodone pain medication for worse pain as needed.  Caution - this can cause drowsiness   If worsening over the next 2-3 days, such as worse swelling, worse pain, worse redness, fever, chills or other, recheck or get re-evaluated immediately
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
## 2021-05-09 ENCOUNTER — Other Ambulatory Visit: Payer: BC Managed Care – PPO

## 2021-05-10 ENCOUNTER — Ambulatory Visit: Payer: BC Managed Care – PPO | Admitting: Medical

## 2021-05-10 ENCOUNTER — Other Ambulatory Visit: Payer: Self-pay

## 2021-05-10 ENCOUNTER — Encounter: Payer: Self-pay | Admitting: Medical

## 2021-05-10 VITALS — BP 138/88 | HR 95 | Temp 97.9°F | Wt 202.0 lb

## 2021-05-10 DIAGNOSIS — L089 Local infection of the skin and subcutaneous tissue, unspecified: Secondary | ICD-10-CM | POA: Diagnosis not present

## 2021-05-10 DIAGNOSIS — Z5189 Encounter for other specified aftercare: Secondary | ICD-10-CM
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
## 2021-05-11 ENCOUNTER — Telehealth: Payer: Self-pay | Admitting: Medical

## 2021-05-11 NOTE — Telephone Encounter (Signed)
I spoke to general surgery.  They do not have ultrasound there but recommended she have this done.  I put an order for a stat soft tissue ultrasound so we can get this done ASAP.  This has to be done before she sees a Psychologist, sport and exercise.  I went ahead and put a referral into general surgery as well so they can see her soon along with the results of the soft tissue ultrasound.  I spoke to Dr. Georgette Dover.  She may or may not be seeing him, but this is his recommendation.  I put in request for both the referral and the ultrasound to be done ASAP.  She could even get a call today about ultrasound today

## 2021-05-11 NOTE — Telephone Encounter (Signed)
Pt was notified and is scheduled already for ultrasound for tomorrow

## 2021-05-11 NOTE — Addendum Note (Signed)
Addended by: Carlena Hurl on: 05/11/2021 07:50 AM   Modules accepted: Orders, Level of Service

## 2021-05-12 ENCOUNTER — Other Ambulatory Visit: Payer: Self-pay | Admitting: Medical

## 2021-05-12 ENCOUNTER — Ambulatory Visit
Admission: RE | Admit: 2021-05-12 | Discharge: 2021-05-12 | Disposition: A | Discharge status: Home / Self Care | Payer: BC Managed Care – PPO | Source: Ambulatory Visit | Attending: Medical | Admitting: Medical

## 2021-05-12 ENCOUNTER — Other Ambulatory Visit: Payer: BC Managed Care – PPO

## 2021-05-12 DIAGNOSIS — L089 Local infection of the skin and subcutaneous tissue, unspecified: Secondary | ICD-10-CM

## 2021-05-12 DIAGNOSIS — Z5189 Encounter for other specified aftercare: Secondary | ICD-10-CM

## 2021-05-12 MED ORDER — SULFAMETHOXAZOLE-TRIMETHOPRIM 800-160 MG PO TABS: 1.0000 | ORAL_TABLET | Freq: Two times a day (BID) | ORAL | 0 refills | Status: DC

## 2021-05-23 ENCOUNTER — Other Ambulatory Visit: Payer: BC Managed Care – PPO

## 2021-05-27 ENCOUNTER — Other Ambulatory Visit: Payer: Self-pay | Admitting: Family Medicine

## 2021-05-27 DIAGNOSIS — I10 Essential (primary) hypertension: Secondary | ICD-10-CM

## 2021-06-22 ENCOUNTER — Other Ambulatory Visit: Payer: BC Managed Care – PPO

## 2021-10-16 ENCOUNTER — Encounter: Payer: BC Managed Care – PPO | Admitting: Family Medicine

## 2022-02-28 ENCOUNTER — Encounter: Payer: Self-pay | Admitting: Internal Medicine

## 2022-04-18 NOTE — Patient Instructions (Signed)
  HEALTH MAINTENANCE RECOMMENDATIONS:  It is recommended that you get at least 30 minutes of aerobic exercise at least 5 days/week (for weight loss, you may need as much as 60-90 minutes). This can be any activity that gets your heart rate up. This can be divided in 10-15 minute intervals if needed, but try and build up your endurance at least once a week.  Weight bearing exercise is also recommended twice weekly.  Eat a healthy diet with lots of vegetables, fruits and fiber.  "Colorful" foods have a lot of vitamins (ie green vegetables, tomatoes, red peppers, etc).  Limit sweet tea, regular sodas and alcoholic beverages, all of which has a lot of calories and sugar.  Up to 1 alcoholic drink daily may be beneficial for women (unless trying to lose weight, watch sugars).  Drink a lot of water.  Calcium recommendations are 1200-1500 mg daily (1500 mg for postmenopausal women or women without ovaries), and vitamin D 1000 IU daily.  This should be obtained from diet and/or supplements (vitamins), and calcium should not be taken all at once, but in divided doses.  Monthly self breast exams and yearly mammograms for women over the age of 31 is recommended.  Sunscreen of at least SPF 30 should be used on all sun-exposed parts of the skin when outside between the hours of 10 am and 4 pm (not just when at beach or pool, but even with exercise, golf, tennis, and yard work!)  Use a sunscreen that says "broad spectrum" so it covers both UVA and UVB rays, and make sure to reapply every 1-2 hours.  Remember to change the batteries in your smoke detectors when changing your clock times in the spring and fall. Carbon monoxide detectors are recommended for your home.  Use your seat belt every time you are in a car, and please drive safely and not be distracted with cell phones and texting while driving.  Please schedule mammogram.

## 2022-04-18 NOTE — Progress Notes (Signed)
Chief Complaint  Patient presents with   Annual Exam    Cpe no other issues, taking amlodipine daily, pt. Wants covid and flu shot, no mammo this year,     Isabella Rodgers is a 51 y.o. female who presents for a complete physical and follow up on hypertension.  L leg wound from broken glass last year--infection, poor healing.  [place] didn't show FB. Ultimately healed up. There were marital issues last year. They worked through it with counseling.  They are in a good place, and helping others now.  Last year she was noted to have some postcoital bleeding, and a cervical polyp was noted on exam. She was referred to GYN, and had polyp removed in 04/2021. Denies any further bleeding.  Hypertension: She reports compliance with taking 7.'5mg'$  amlodipine daily. BP's are running 120's/low 80's, occasionally might see up to 129/90. She denies dizziness, headaches, edema (only after eating [place] food), chest pain, palpitations. Denies side effects to medications.   Vitamin D deficiency:  Her last level was 34.7 in 12/2019 when taking 2000 IU daily (had been 30.4 when taking 1000 IU daily the year prior).   She is currently taking 2000  IU daily.   H/o Iron deficiency anemia--had improved s/p ablation. Still had periods s/p ablation, but no longer heavy and no cramping. She stopped taking iron a couple of years ago, and didn't have any recurrent anemia. Went 18 months without any cycle, until 01/01/20. Then had a regular period, possibly one after.  Hasn't had any further periods.  She does have hot flashes and night sweats, tolerable.   Lab Results  Component Value Date   WBC 7.1 12/27/2020   HGB 12.6 12/27/2020   HCT 40.0 12/27/2020   MCV 88.1 12/27/2020   PLT 224 12/27/2020   Obesity/weight gain--she reports that her problem is potatoes and bread.  Craves potatoes.  Immunization History  Administered Date(s) Administered   Influenza, Seasonal, Injecte, Preservative Fre 07/07/2012    Influenza,inj,Quad PF,6+ Mos 04/15/2014, 04/13/2015, 04/13/2021   Influenza-Unspecified 04/18/2020   PFIZER(Purple Top)SARS-COV-2 Vaccination 08/27/2019, 09/18/2019, 04/18/2020   PPD Test 02/10/2016   Pfizer Covid-19 Vaccine Bivalent Booster 61yr & up 04/13/2021   Tdap 09/17/2012, 01/17/2018   Last Pap smear:  11/2016 normal, no high risk HPV Last mammogram: last had L mammo/US in 08/2020 Last colonoscopy: 04/2020 with Dr. AHavery Moros  Adenomatous polyp, 7 year f/u recommended Last DEXA: never Dentist: twice yearly Ophtho: yearly (wears contacts/glasses) Exercise: She gets at least an hour walk once a week (3+ miles).  Some walking if she has free time during the day. Teaches courses in the evenings. Has dumbbells and ankle weights at home, not using.  Lipids: Lab Results  Component Value Date   CHOL 168 04/13/2021   HDL 45 04/13/2021   LDLCALC 106 (H) 04/13/2021   TRIG 92 04/13/2021   CHOLHDL 3.7 04/13/2021     PMH, PSH, SH and FH reviewed  Outpatient Encounter Medications as of 04/19/2022  Medication Sig Note   cholecalciferol (VITAMIN D3) 25 MCG (1000 UT) tablet Take 2,000 Units by mouth daily.     [DISCONTINUED] amLODipine (NORVASC) 5 MG tablet TAKE 1 AND 1/2 TABLETS (7.'5MG'$  TOTAL)DAILY    amLODipine (NORVASC) 5 MG tablet Take 1.5 tablets (7.5 mg total) by mouth daily.    ibuprofen (ADVIL,MOTRIN) 800 MG tablet Take 1 tablet (800 mg total) by mouth 3 (three) times daily. (Patient not taking: Reported on 04/19/2022) 04/19/2022: Prn been a while   MAGNESIUM  PO Take by mouth. occ use (Patient not taking: Reported on 04/19/2022) 04/19/2022: prn   [DISCONTINUED] amoxicillin-clavulanate (AUGMENTIN) 875-125 MG tablet Take 1 tablet by mouth 2 (two) times daily. (Patient not taking: Reported on 04/19/2022) 05/10/2021: Has 2 days left   [DISCONTINUED] HYDROcodone-acetaminophen (NORCO) 5-325 MG tablet Take 1 tablet by mouth every 6 (six) hours as needed.    [DISCONTINUED]  sulfamethoxazole-trimethoprim (BACTRIM DS) 800-160 MG tablet Take 1 tablet by mouth 2 (two) times daily. (Patient not taking: Reported on 04/19/2022)    No facility-administered encounter medications on file as of 04/19/2022.   No Known Allergies   ROS:  The patient denies anorexia, fever, vision changes, decreased hearing, ear pain, sore throat, breast concerns, headaches, chest pain, palpitations, dizziness, syncope, dyspnea on exertion, cough, swelling, nausea, vomiting, diarrhea, abdominal pain, melena, hematochezia, indigestion/heartburn, hematuria, incontinence, dysuria, vaginal bleeding, discharge, odor or itch, genital lesions, joint pains, numbness, tingling, weakness, tremor, suspicious skin lesions, depression, anxiety, abnormal bleeding/bruising, or enlarged lymph nodes. No cycles in about 2 years. Hot flashes/night sweats, tolerable No abnormal vaginal discharge, dryness. No postcoital bleeding Occasional hemorrhoids (swelling/itching, no bleeding).  Occ constipation (r/b Mg tablets) Weight gain. Max 226#--lost some recently by cutting carbs during the week.    PHYSICAL EXAM:  BP 128/82   Pulse 83   Ht 5' 8.25" (1.734 m)   Wt 214 lb 6.4 oz (97.3 kg)   BMI 32.36 kg/m    Wt Readings from Last 3 Encounters:  04/19/22 214 lb 6.4 oz (97.3 kg)  05/10/21 202 lb (91.6 kg)  05/02/21 202 lb 12.8 oz (92 kg)    General Appearance:     Alert, cooperative, no distress, appears stated age   Head:     Normocephalic, without obvious abnormality, atraumatic   Eyes:     PERRL, conjunctiva/corneas clear, EOM's intact, fundi benign   Ears:     Normal TM's and external ear canals   Nose:    No drainage or sinus tenderness  Throat:    Normal mucosa  Neck:    Supple, no lymphadenopathy;  thyroid:  borderline size, no nodules, nontender; no carotid bruit or JVD   Back:     Spine nontender, no curvature, ROM normal, no CVA tenderness   Lungs:      Clear to auscultation bilaterally without  wheezes, rales or ronchi; respirations unlabored   Chest Wall:     No tenderness or deformity    Heart:     Regular rate and rhythm, S1 and S2 normal, no murmur, rub or gallop   Breast Exam:    No nipple inversion, discharge, skin dimpling, masses or axillary lymphadenopathy  Abdomen:      Soft, non-tender, nondistended, normoactive bowel sounds, no masses, no hepatosplenomegaly   Genitalia:   normal external genitalia, BUS and vagina normal. Uterus and adnexa normal, nontender, no mass. No cervical lesions or abnormal discharge. No cervical motion tenderness. Pap performed  Rectal:    Normal sphincter tone, no mass, heme negative stool  Extremities:    No clubbing, cyanosis or edema.  Pulses:    2+ and symmetric all extremities   Skin:    Skin color, texture, turgor normal, no rashes or suspicious lesions.  Lymph nodes:    Cervical, supraclavicular, inguinal and axillary nodes normal   Neurologic:    Normal strength, sensation and gait; reflexes 2+ and symmetric throughout  Psych:   Normal mood, affect, hygiene and grooming.   ASSESSMENT/PLAN:  Annual physical exam - Plan: CBC with Differential/Platelet, Comprehensive metabolic panel, Lipid panel, VITAMIN D 25 Hydroxy (Vit-D Deficiency, Fractures), TSH, Cytology - PAP([place]), Flu Vaccine QUAD 6+ mos PF IM (Fluarix Quad PF), Pfizer Fall 2023 Covid-19 Vaccine 77yr and older  Essential hypertension, benign - controlled, cont 7.'5mg'$  amlodipine  - Plan: Comprehensive metabolic panel, Lipid panel, amLODipine (NORVASC) 5 MG tablet  Vitamin D deficiency - continue supplements - Plan: VITAMIN D 25 Hydroxy (Vit-D Deficiency, Fractures)  Weight gain - Plan: TSH  BMI 32.0-32.9,adult - counseled re: diet and exercise. To cut back on carbs and increase exercise  Need for influenza vaccination - Plan: Flu Vaccine QUAD 6+ mos PF IM (Fluarix Quad PF)  Need for COVID-19 vaccine - Plan: PRodantheFall 2023 Covid-19 Vaccine 182yrand  older   Discussed monthly self breast exams and yearly mammograms (past due, reminded to schedule); at least 30 minutes of aerobic activity at least 5 days/week, weight bearing exercise 2x/wk; proper sunscreen use reviewed; healthy diet, including goals of calcium and vitamin D intake and alcohol recommendations (less than or equal to 1 drink/day) reviewed; regular seatbelt use; changing batteries in smoke detectors.  Immunization recommendations discussed--continue yearly flu shots, given today. Bivalent COVID booster given today. Shingrix age 51 Colon cancer screening UTD, due again 04/2027 Healthy diet reviewed, weight loss encouraged.  F/u 1 year

## 2022-04-19 ENCOUNTER — Encounter: Payer: Self-pay | Admitting: Family Medicine

## 2022-04-19 ENCOUNTER — Ambulatory Visit (INDEPENDENT_AMBULATORY_CARE_PROVIDER_SITE_OTHER): Payer: BC Managed Care – PPO | Admitting: Family Medicine

## 2022-04-19 ENCOUNTER — Other Ambulatory Visit (HOSPITAL_COMMUNITY)
Admission: RE | Admit: 2022-04-19 | Discharge: 2022-04-19 | Disposition: A | Discharge status: Home / Self Care | Payer: BC Managed Care – PPO | Source: Ambulatory Visit | Attending: Family Medicine | Admitting: Family Medicine

## 2022-04-19 VITALS — BP 128/82 | HR 83 | Ht 68.25 in | Wt 214.4 lb

## 2022-04-19 DIAGNOSIS — R635 Abnormal weight gain: Secondary | ICD-10-CM

## 2022-04-19 DIAGNOSIS — Z0000 Encounter for general adult medical examination without abnormal findings: Secondary | ICD-10-CM | POA: Diagnosis present

## 2022-04-19 DIAGNOSIS — E559 Vitamin D deficiency, unspecified: Secondary | ICD-10-CM | POA: Diagnosis not present

## 2022-04-19 DIAGNOSIS — I10 Essential (primary) hypertension: Secondary | ICD-10-CM | POA: Diagnosis not present

## 2022-04-19 DIAGNOSIS — Z23 Encounter for immunization: Secondary | ICD-10-CM | POA: Diagnosis not present

## 2022-04-19 DIAGNOSIS — Z6832 Body mass index (BMI) 32.0-32.9, adult: Secondary | ICD-10-CM

## 2022-04-19 MED ORDER — AMLODIPINE BESYLATE 5 MG PO TABS: 7.5000 mg | ORAL_TABLET | Freq: Every day | ORAL | 3 refills | Status: DC

## 2022-04-20 LAB — LIPID PANEL
Chol/HDL Ratio: 3.7 ratio (ref 0.0–4.4)
Cholesterol, Total: 185 mg/dL (ref 100–199)
HDL: 50 mg/dL (ref >39)
LDL Chol Calc (NIH): 125 mg/dL — ABNORMAL HIGH (ref 0–99)
Triglycerides: 52 mg/dL (ref 0–149)
VLDL Cholesterol Cal: 10 mg/dL (ref 5–40)

## 2022-04-20 LAB — CBC WITH DIFFERENTIAL/PLATELET
Basophils Absolute: 0 10*3/uL (ref 0.0–0.2)
Basos: 0 %
EOS (ABSOLUTE): 0.2 10*3/uL (ref 0.0–0.4)
Eos: 3 %
Hematocrit: 42.5 % (ref 34.0–46.6)
Hemoglobin: 13.7 g/dL (ref 11.1–15.9)
Immature Grans (Abs): 0 10*3/uL (ref 0.0–0.1)
Immature Granulocytes: 0 %
Lymphocytes Absolute: 3 10*3/uL (ref 0.7–3.1)
Lymphs: 51 %
MCH: 27.4 pg (ref 26.6–33.0)
MCHC: 32.2 g/dL (ref 31.5–35.7)
MCV: 85 fL (ref 79–97)
Monocytes Absolute: 0.4 10*3/uL (ref 0.1–0.9)
Monocytes: 6 %
Neutrophils Absolute: 2.3 10*3/uL (ref 1.4–7.0)
Neutrophils: 40 %
Platelets: 268 10*3/uL (ref 150–450)
RBC: 5 x10E6/uL (ref 3.77–5.28)
RDW: 12.5 % (ref 11.7–15.4)
WBC: 5.8 10*3/uL (ref 3.4–10.8)

## 2022-04-20 LAB — COMPREHENSIVE METABOLIC PANEL
ALT: 13 IU/L (ref 0–32)
AST: 16 IU/L (ref 0–40)
Albumin/Globulin Ratio: 1.5 (ref 1.2–2.2)
Albumin: 4.7 g/dL (ref 3.9–4.9)
Alkaline Phosphatase: 89 IU/L (ref 44–121)
BUN/Creatinine Ratio: 16 (ref 9–23)
BUN: 15 mg/dL (ref 6–24)
Bilirubin Total: 0.3 mg/dL (ref 0.0–1.2)
CO2: 24 mmol/L (ref 20–29)
Calcium: 9.4 mg/dL (ref 8.7–10.2)
Chloride: 103 mmol/L (ref 96–106)
Creatinine, Ser: 0.95 mg/dL (ref 0.57–1.00)
Globulin, Total: 3.2 g/dL (ref 1.5–4.5)
Glucose: 96 mg/dL (ref 70–99)
Potassium: 4.8 mmol/L (ref 3.5–5.2)
Sodium: 141 mmol/L (ref 134–144)
Total Protein: 7.9 g/dL (ref 6.0–8.5)
eGFR: 74 mL/min/{1.73_m2} (ref >59)

## 2022-04-20 LAB — TSH: TSH: 1.26 u[IU]/mL (ref 0.450–4.500)

## 2022-04-20 LAB — VITAMIN D 25 HYDROXY (VIT D DEFICIENCY, FRACTURES): Vit D, 25-Hydroxy: 46 ng/mL (ref 30.0–100.0)

## 2022-04-24 LAB — CYTOLOGY - PAP
Comment: NEGATIVE
Diagnosis: NEGATIVE
High risk HPV: NEGATIVE

## 2022-08-15 ENCOUNTER — Other Ambulatory Visit: Payer: Self-pay | Admitting: Family Medicine

## 2022-08-15 DIAGNOSIS — Z1231 Encounter for screening mammogram for malignant neoplasm of breast: Secondary | ICD-10-CM

## 2022-09-08 IMAGING — US US BREAST*L* LIMITED INC AXILLA
1 series · 13 of 25 positions shown · non-contrast
Comparison: Previous exam(s).

CLINICAL DATA: 49-year-old female presenting for first six-month
follow-up of probably benign left breast masses.

EXAM:
DIGITAL DIAGNOSTIC UNILATERAL LEFT MAMMOGRAM WITH TOMOSYNTHESIS AND
CAD; ULTRASOUND LEFT BREAST LIMITED
TECHNIQUE: Left digital diagnostic mammography and breast tomosynthesis was
performed. The images were evaluated with computer-aided detection.;
Targeted ultrasound examination of the left breast was performed

[Series 1: us breast*left* limited inc axilla · 0.06mm/px · 13 of 26 slices shown]
[im 1/26]
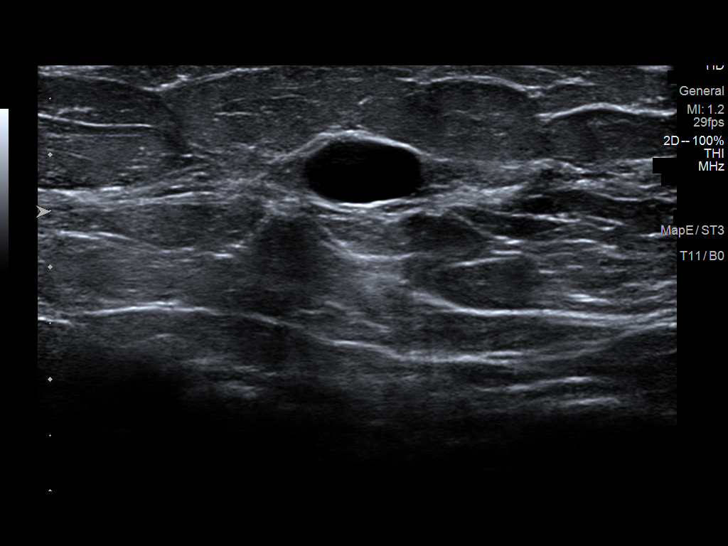
[im 3/26]
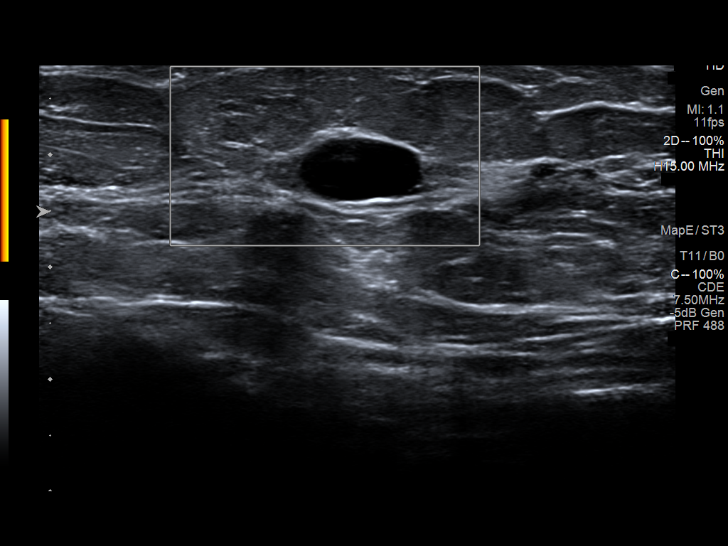
[im 5/26]
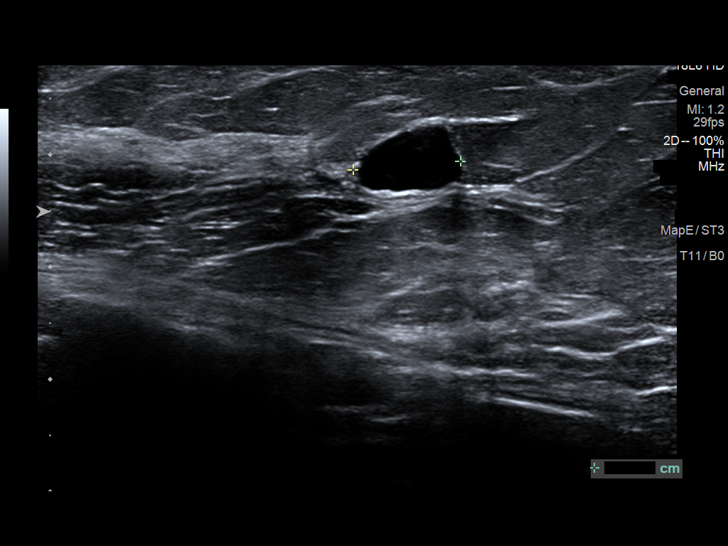
[im 7/26]
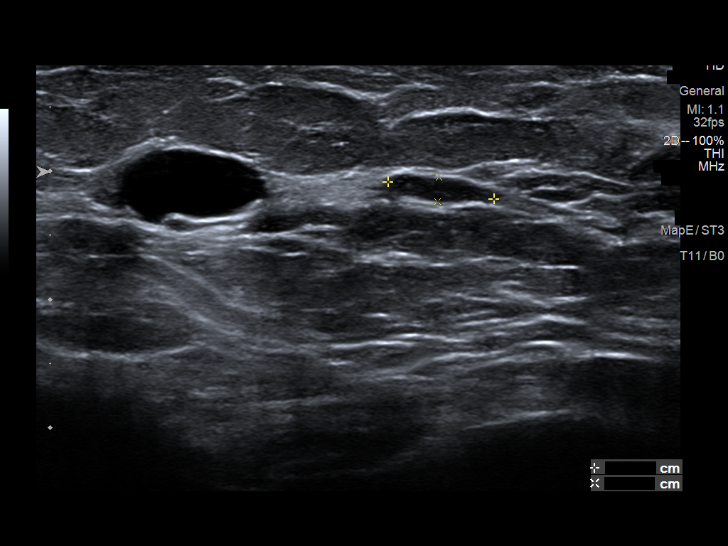
[im 9/26]
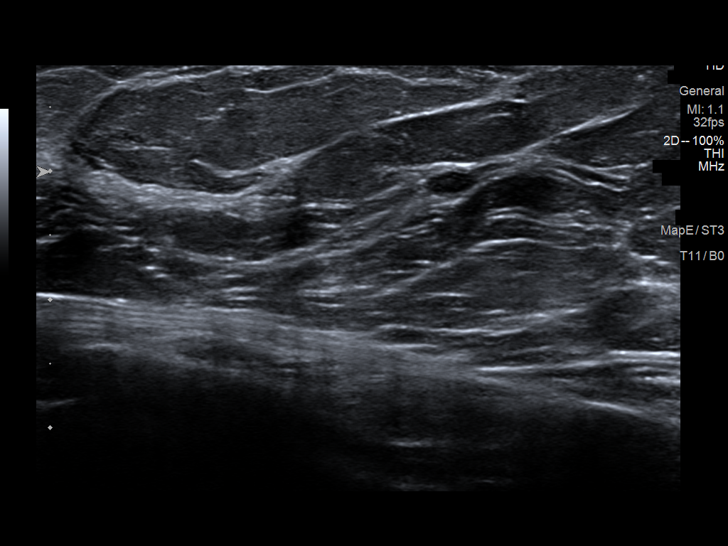
[im 11/26]
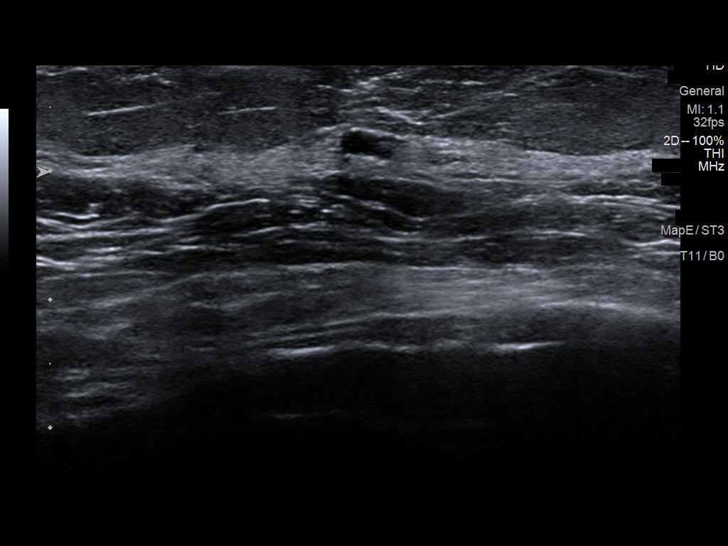
[im 13/26]
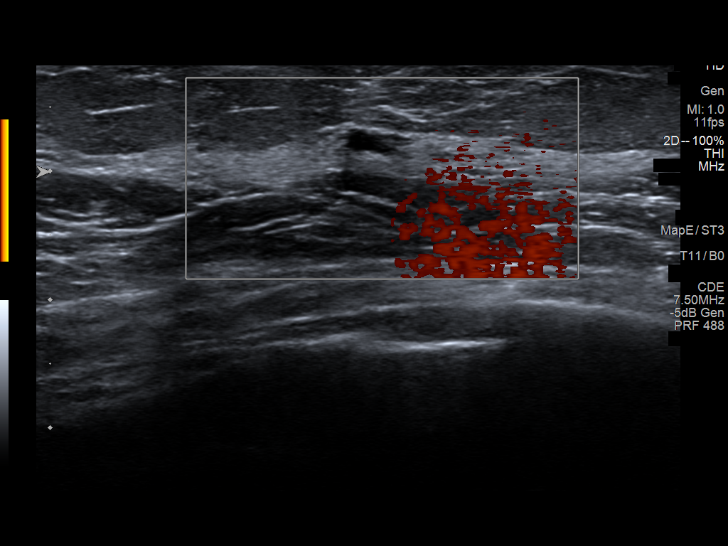
[im 15/26]
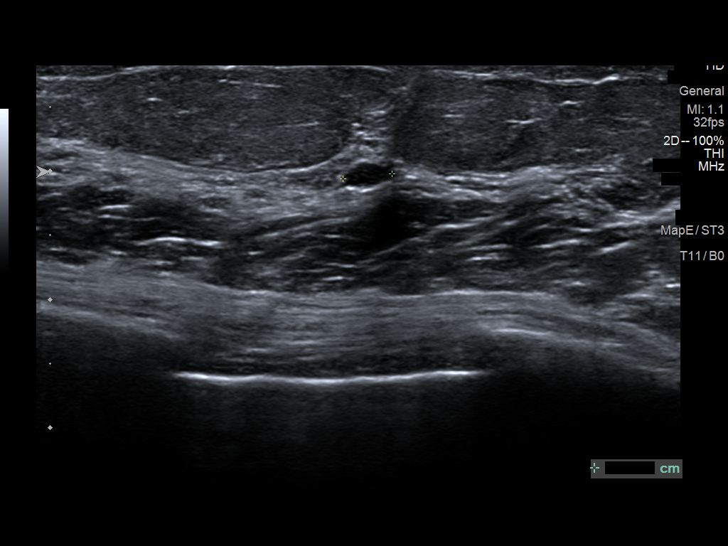
[im 17/26]
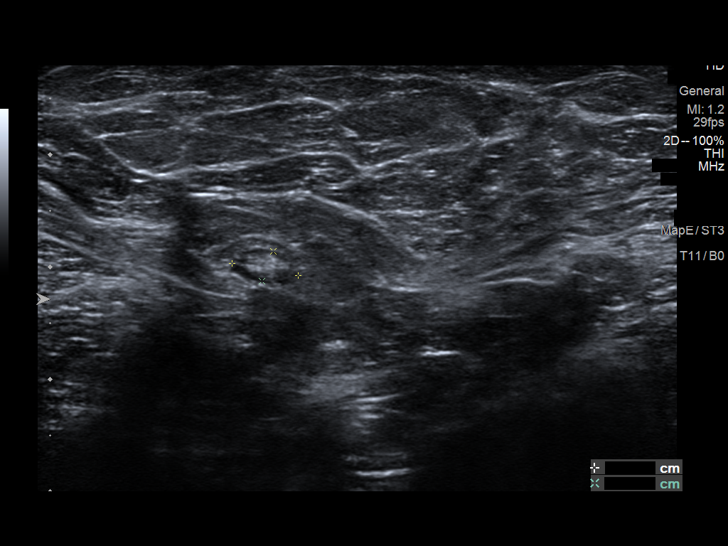
[im 19/26]
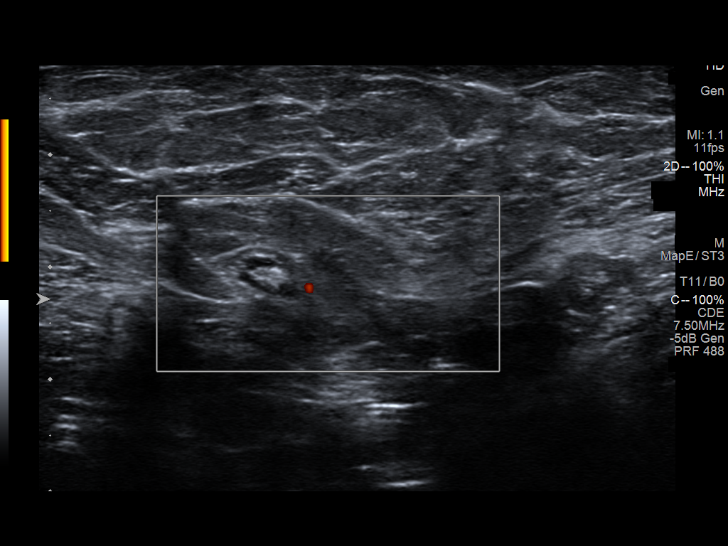
[im 21/26]
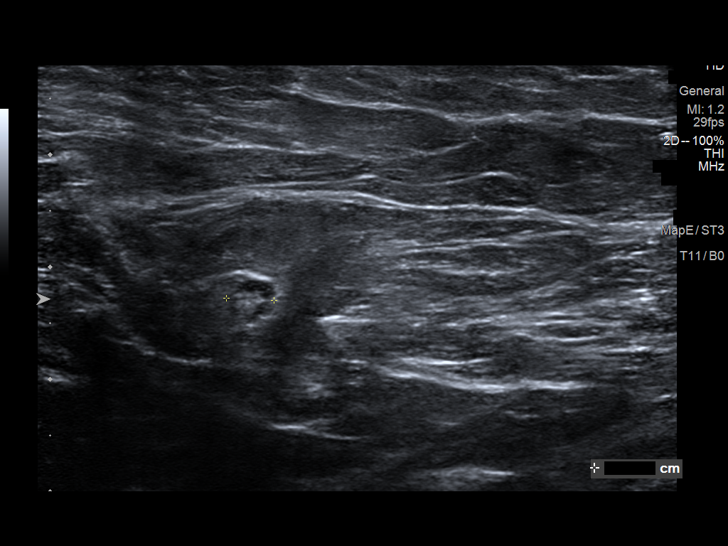
[im 23/26]
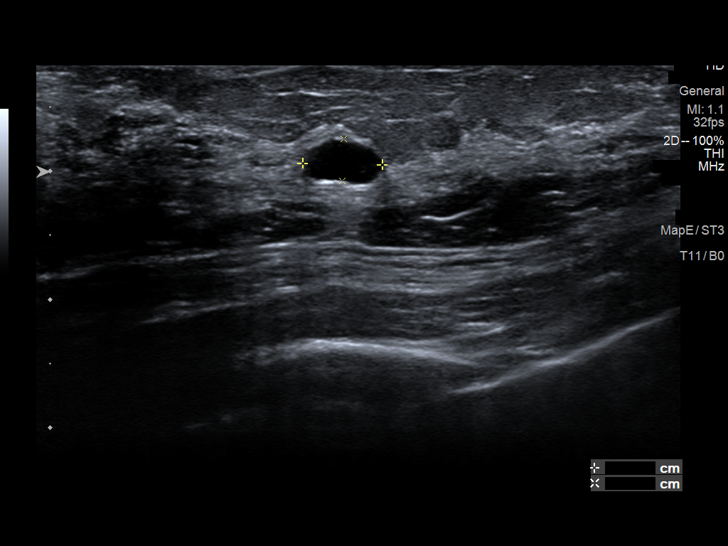
[im 26/26]
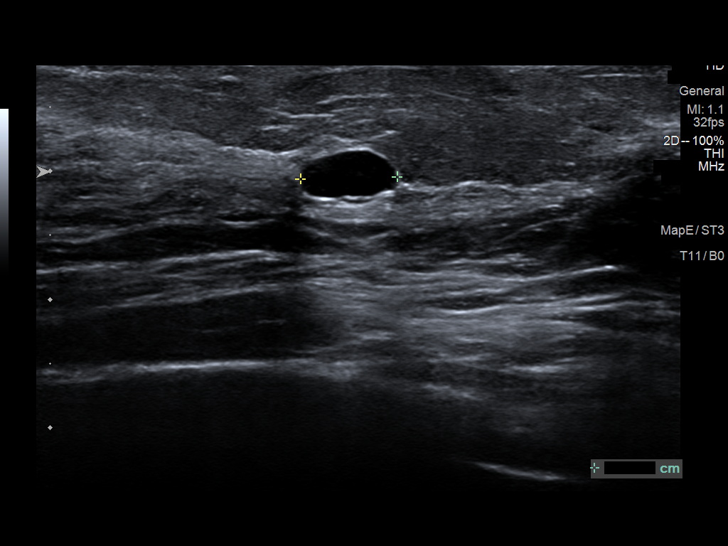

[13 of 25 positions shown; findings below may reference images not displayed]

ACR Breast Density Category b: There are scattered areas of
fibroglandular density.
FINDINGS: Stable appearance of a circumscribed mass in the lower inner left
breast. There is a new oval, circumscribed equal density mass in the
upper outer left breast. Otherwise, no additional suspicious
findings in the left breast.

Targeted ultrasound is performed, showing stable appearance of 2
oval, circumscribed hypoechoic masses along the 6 o'clock position 4
cm from the nipple on the left. They measure 8 x 4 x 2 mm
(previously 6 x 6 x 2 mm) and 4 x 3 x 2 mm (previously 3 x 3 x 2
mm). Slight variations are likely due to differences in positioning
and technique. An additional simple cyst in this region is benign.

Additional evaluation of the upper outer left breast demonstrates a
morphologically normal intramammary lymph node along the [DATE]
position 14 cm from the nipple. Additionally, there is an oval,
circumscribed anechoic mass in this region measuring 8 x 6 x 3 mm.
This correlates with the additional mammographic finding.
IMPRESSION: 1. Stable, probably benign left breast masses x2 at the 6 o'clock
position. Recommendation is for continued imaging follow-up.
2. Additional benign simple cysts. No further imaging follow-up
required.

RECOMMENDATION:
Bilateral diagnostic mammogram and left breast ultrasound in 6
months.

I have discussed the findings and recommendations with the patient.
If applicable, a reminder letter will be sent to the patient
regarding the next appointment.

BI-RADS CATEGORY  3: Probably benign.

## 2022-09-08 IMAGING — MG MM DIGITAL DIAGNOSTIC UNILAT*L* W/ TOMO W/ CAD
4 series · 4 of 12 positions shown · non-contrast
Comparison: Previous exam(s).

CLINICAL DATA: 49-year-old female presenting for first six-month
follow-up of probably benign left breast masses.

EXAM:
DIGITAL DIAGNOSTIC UNILATERAL LEFT MAMMOGRAM WITH TOMOSYNTHESIS AND
CAD; ULTRASOUND LEFT BREAST LIMITED
TECHNIQUE: Left digital diagnostic mammography and breast tomosynthesis was
performed. The images were evaluated with computer-aided detection.;
Targeted ultrasound examination of the left breast was performed

[L MLO synth-2D]
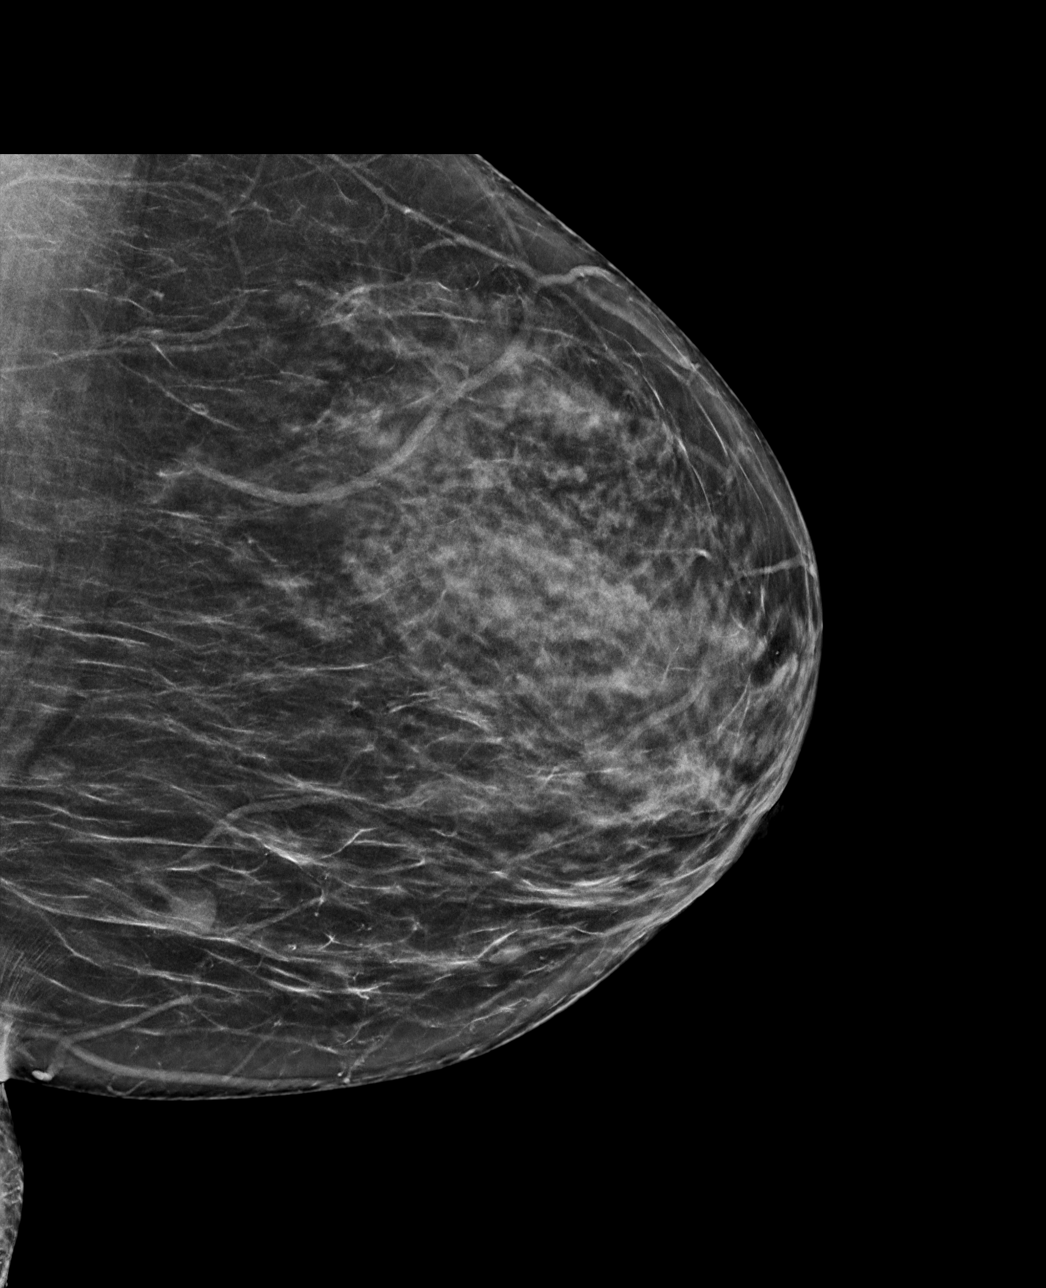

[L CC synth-2D]
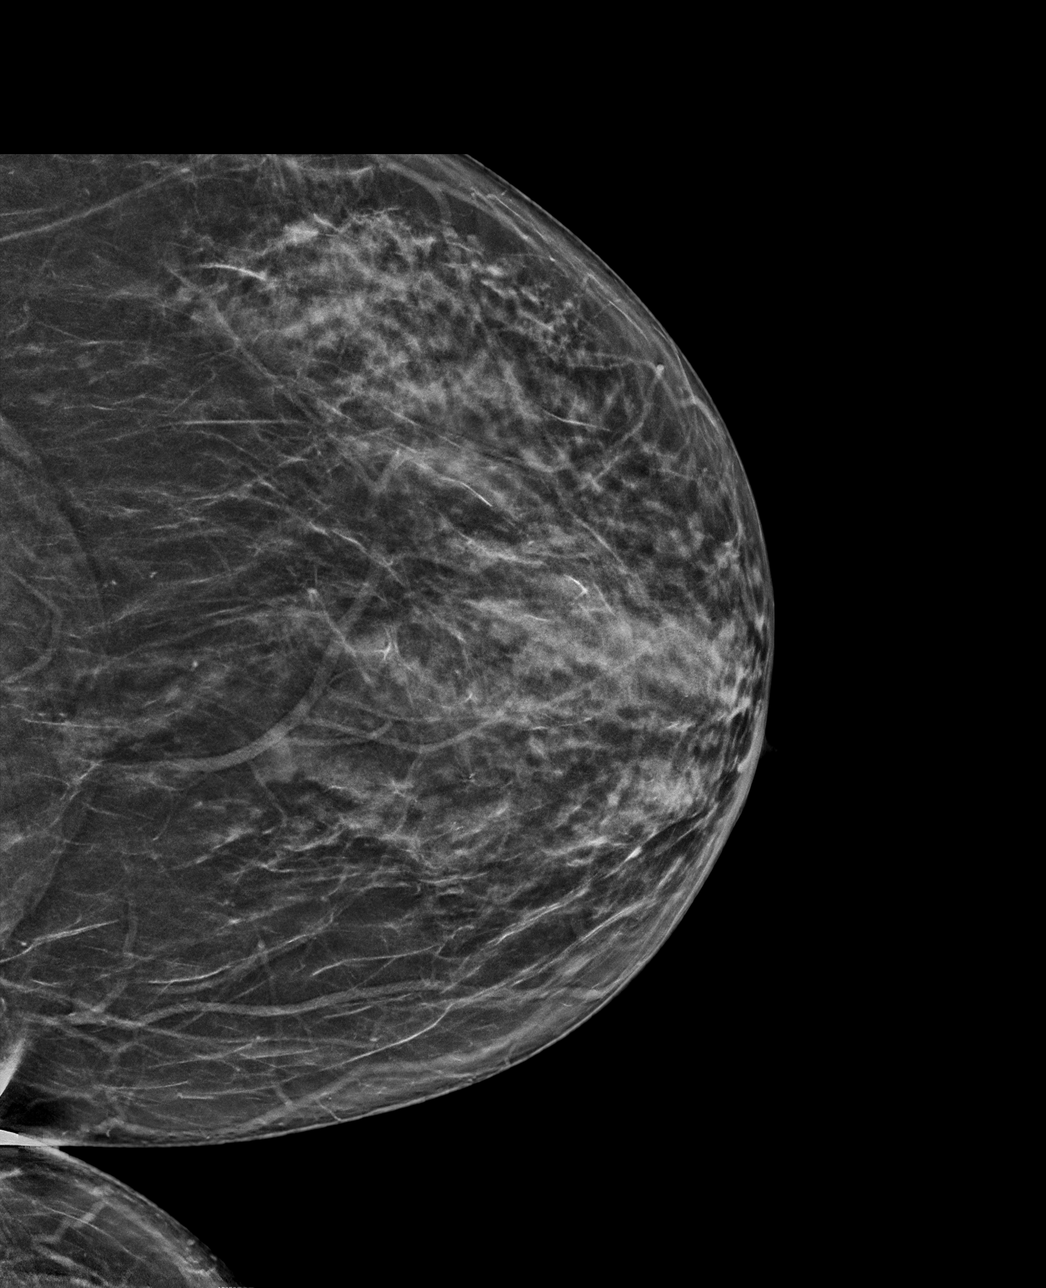

[L CC tomo · tomo slice 33/64.0]
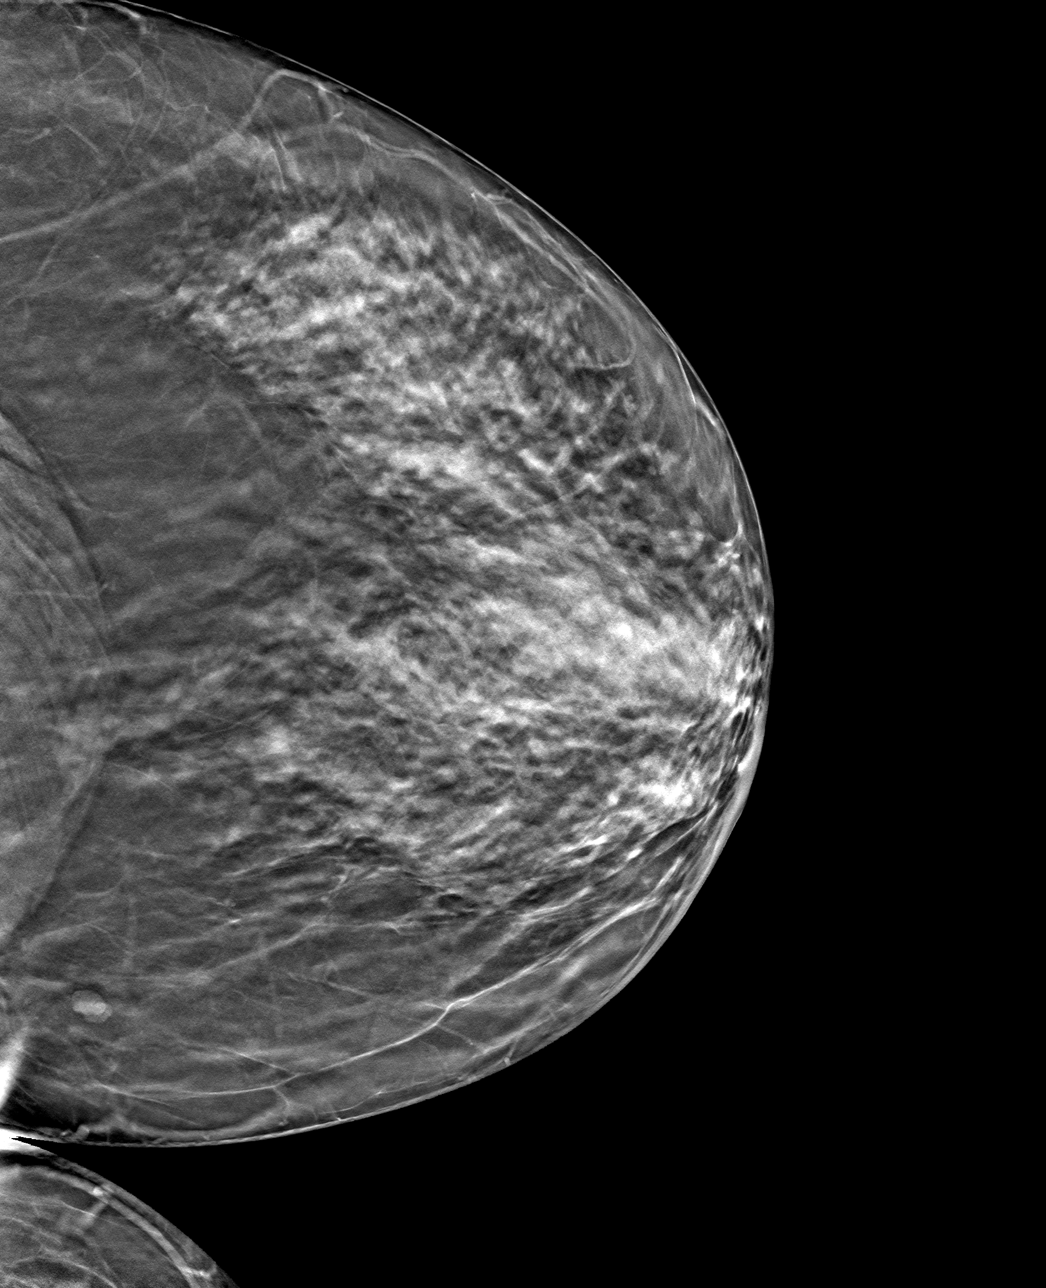

[L MLO tomo · tomo slice 35/70.0]
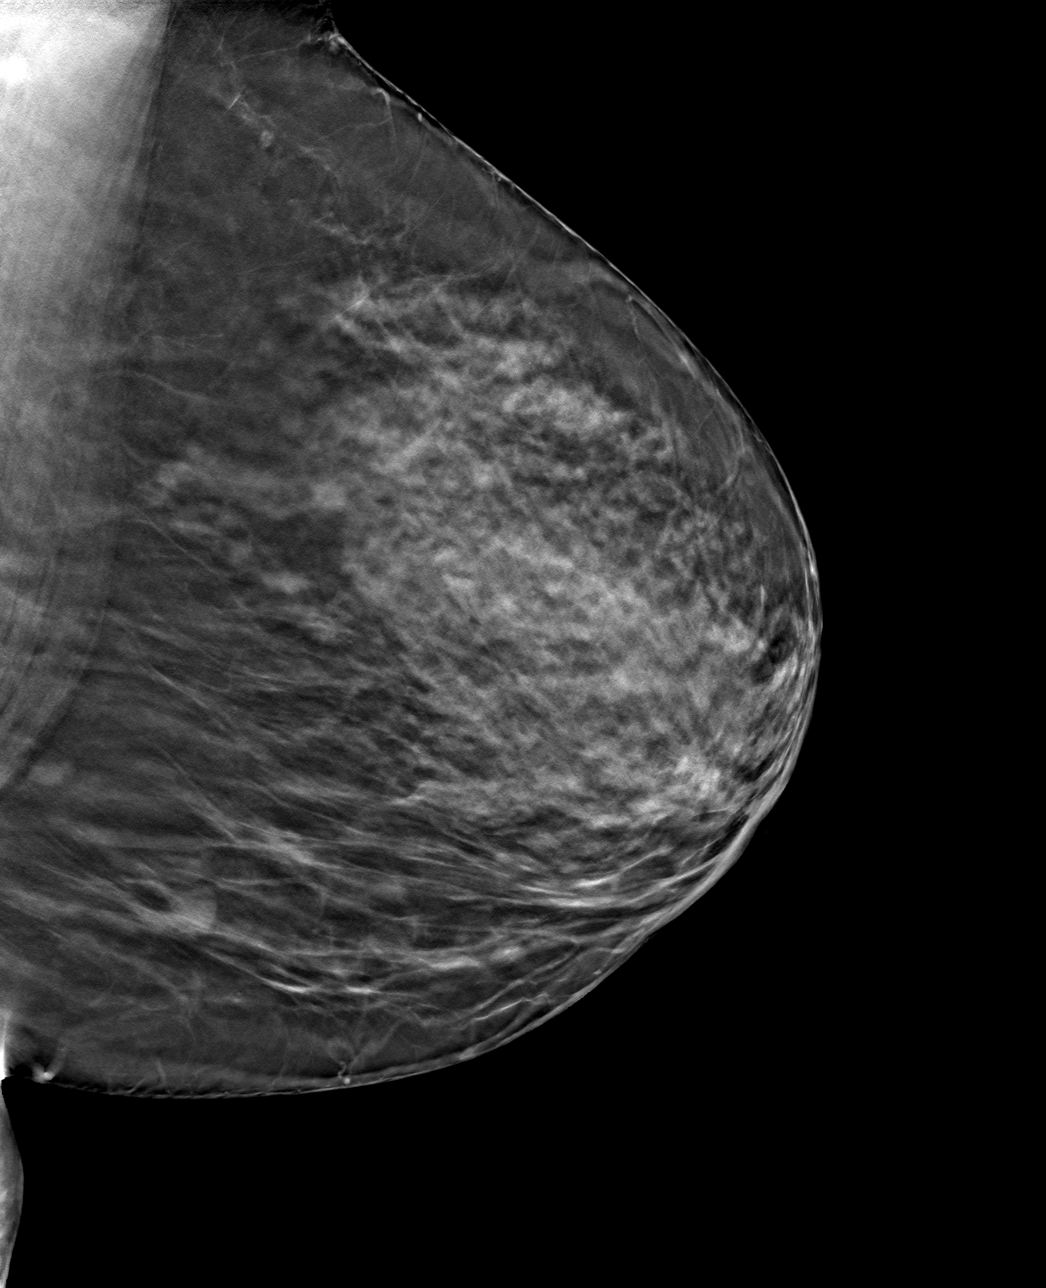

[4 of 12 positions shown; findings below may reference images not displayed]

ACR Breast Density Category b: There are scattered areas of
fibroglandular density.
FINDINGS: Stable appearance of a circumscribed mass in the lower inner left
breast. There is a new oval, circumscribed equal density mass in the
upper outer left breast. Otherwise, no additional suspicious
findings in the left breast.

Targeted ultrasound is performed, showing stable appearance of 2
oval, circumscribed hypoechoic masses along the 6 o'clock position 4
cm from the nipple on the left. They measure 8 x 4 x 2 mm
(previously 6 x 6 x 2 mm) and 4 x 3 x 2 mm (previously 3 x 3 x 2
mm). Slight variations are likely due to differences in positioning
and technique. An additional simple cyst in this region is benign.

Additional evaluation of the upper outer left breast demonstrates a
morphologically normal intramammary lymph node along the [DATE]
position 14 cm from the nipple. Additionally, there is an oval,
circumscribed anechoic mass in this region measuring 8 x 6 x 3 mm.
This correlates with the additional mammographic finding.
IMPRESSION: 1. Stable, probably benign left breast masses x2 at the 6 o'clock
position. Recommendation is for continued imaging follow-up.
2. Additional benign simple cysts. No further imaging follow-up
required.

RECOMMENDATION:
Bilateral diagnostic mammogram and left breast ultrasound in 6
months.

I have discussed the findings and recommendations with the patient.
If applicable, a reminder letter will be sent to the patient
regarding the next appointment.

BI-RADS CATEGORY  3: Probably benign.

## 2022-10-02 ENCOUNTER — Ambulatory Visit
Admission: RE | Admit: 2022-10-02 | Discharge: 2022-10-02 | Disposition: A | Discharge status: Home / Self Care | Payer: BC Managed Care – PPO | Source: Ambulatory Visit | Attending: Family Medicine | Admitting: Family Medicine

## 2022-10-02 ENCOUNTER — Other Ambulatory Visit: Payer: Self-pay | Admitting: Family Medicine

## 2022-10-02 DIAGNOSIS — N6320 Unspecified lump in the left breast, unspecified quadrant: Secondary | ICD-10-CM

## 2022-10-02 DIAGNOSIS — Z1231 Encounter for screening mammogram for malignant neoplasm of breast: Secondary | ICD-10-CM

## 2022-10-15 ENCOUNTER — Ambulatory Visit
Admission: RE | Admit: 2022-10-15 | Discharge: 2022-10-15 | Disposition: A | Discharge status: Home / Self Care | Payer: BC Managed Care – PPO | Source: Ambulatory Visit | Attending: Family Medicine | Admitting: Family Medicine

## 2022-10-15 ENCOUNTER — Ambulatory Visit: Payer: BC Managed Care – PPO

## 2022-10-15 DIAGNOSIS — N6320 Unspecified lump in the left breast, unspecified quadrant: Secondary | ICD-10-CM

## 2023-04-22 ENCOUNTER — Encounter: Payer: Self-pay | Admitting: Family Medicine

## 2023-04-22 ENCOUNTER — Ambulatory Visit: Payer: BC Managed Care – PPO | Admitting: Family Medicine

## 2023-04-22 VITALS — BP 120/78 | HR 72 | Temp 98.7°F | Ht 69.0 in | Wt 223.6 lb

## 2023-04-22 DIAGNOSIS — M546 Pain in thoracic spine: Secondary | ICD-10-CM | POA: Diagnosis not present

## 2023-04-22 DIAGNOSIS — I10 Essential (primary) hypertension: Secondary | ICD-10-CM

## 2023-04-22 DIAGNOSIS — T63481A Toxic effect of venom of other arthropod, accidental (unintentional), initial encounter: Secondary | ICD-10-CM

## 2023-04-22 NOTE — Patient Instructions (Signed)
  You have a local reaction to a recent sting. I've marked the area of redness. This should improve over the next 1-2 days If you develop fever, and if the area that is red gets a deeper red and continues to spread, this could indicate an infection.  Ice and elevated the swollen area. Take a once daily antihistamine such as claritin, allegra or zyrtec. Take one of these daily until better. If you have additional itching, you can also use topical mediations such as hydrocortisone cream (up to 3x/day), calamine lotion, Sarna anti-itch lotion. You can take a dose of benadryl at nighttime if needed.  Try heat to the area of your back pain You can take ibuprofen up to 600 mg (3 pills of the over-the-counter ibuprofen together) with food, every 6-8 hours if needed for pain. You can also use tylenol, if needed

## 2023-04-22 NOTE — Progress Notes (Signed)
Chief Complaint  Patient presents with   Insect Bite    Stung Sat left forearm. Very itchy and painful. While at work today she started having back spasms. Took nyquil last night. Used hydrocortisone cream yesterday. Would like to wait on flu shot until her CPE.    She was stung by a yellow jacket on her left forearm on 10/26. She cleaned it with alcohol. Yesterday she used hydrocortisone cream, just once.  It helped some with the itching.  Area of sting is itchy, painful and warm. Swelling has spread some, moving across the anterior aspect of the forearm. She never tried icing it.  Last night she took Nyquil (it was itching and uncomfortable), so that she could sleep.. She hadn't slept well last night, was tossing and turning a lot last night. While at work today, her back started hurting. It seem muscular to her, left sided. Denies radiation. Hasn't taken anything for pain today.   HTN--wants to discuss changing BP medication.  Having edema related to amlodipine. Has only been taking it twice a week. Denies HA, CP. Edema improved since cut back.    PMH, PSH, SH reviewed  Outpatient Encounter Medications as of 04/22/2023  Medication Sig Note   cholecalciferol (VITAMIN D3) 25 MCG (1000 UT) tablet Take 2,000 Units by mouth daily.     MAGNESIUM PO Take by mouth. occ use    Pseudoeph-Doxylamine-DM-APAP (NYQUIL PO) Take 30 mLs by mouth. 04/22/2023: Just one dose last night   amLODipine (NORVASC) 5 MG tablet Take 1.5 tablets (7.5 mg total) by mouth daily. (Patient not taking: Reported on 04/22/2023) 04/22/2023: Taking twice weekly due to ankles swelling   ibuprofen (ADVIL,MOTRIN) 800 MG tablet Take 1 tablet (800 mg total) by mouth 3 (three) times daily. (Patient not taking: Reported on 04/19/2022) 04/22/2023: As needed   No facility-administered encounter medications on file as of 04/22/2023.   No Known Allergies   ROS: Denies numbness, tingling, weakness, no bowel/bladder problems.  No urinary complaints. No f/c or GI symptoms. No shortness of breath, wheezing, mouth or throat swelling. No HA, dizziness, CP    PHYSICAL EXAM:  BP 120/78   Pulse 72   Temp 98.7 F (37.1 C) (Tympanic)   Ht 5\' 9"  (1.753 m)   Wt 223 lb 9.6 oz (101.4 kg)   BMI 33.02 kg/m   Pleasant, well-appearing female in no distress HEENT: conjunctiva and sclera are clear, EOMI. OP clear, tongue/OP normal. Neck: no lymphadenopathy or mass Heart: regular rate and rhythm Lungs: clear bilaterally L forearm:  There is a 6 x 4.5 cm area of erythema and soft tissue swelling at L forearm--located anterolaterally  There is a small, separate circular area 2 x 2 cm at the medial aspect of the mid-forearm is also erythematous and slightly swollen.  No known sting there, not tichy  Back: no spinal or CVA tenderness. Area of discomfort lateral ribs/upper flank. Not reproducible No muscle spasm. Psych: normal mood, affect, hygiene and grooming Neuro: alert and oriented, cranial nerves grossly intact, normal gait.    ASSESSMENT/PLAN:  Insect stings, accidental or unintentional, initial encounter - occurred 10/26. Local reaction, no e/o allergic reaction. Supportive measures reviewed, along with s/sx allergic reaction and cellulitis (erythema marked)  Acute left-sided thoracic back pain - no appreciable spasm. Suspect MSK. Trial of heat, NSAID and/or Tyl.  f/u if persists, worsens, urinary complaints.  Essential hypertension, benign - BP good today. Will address her concerns about meds at her upcoming wellness exam.  I spent 31 minutes dedicated to the care of this patient, including pre-visit review of records, face to face time, post-visit ordering of testing and documentation.   You have a local reaction to a recent sting. I've marked the area of redness. This should improve over the next 1-2 days If you develop fever, and if the area that is red gets a deeper red and continues to spread, this  could indicate an infection.  Ice and elevated the swollen area. Take a once daily antihistamine such as claritin, allegra or zyrtec. Take one of these daily until better. If you have additional itching, you can also use topical mediations such as hydrocortisone cream (up to 3x/day), calamine lotion, Sarna anti-itch lotion. You can take a dose of benadryl at nighttime if needed.  Try heat to the area of your back pain You can take ibuprofen up to 600 mg (3 pills of the over-the-counter ibuprofen together) with food, every 6-8 hours if needed for pain. You can also use tylenol, if needed

## 2023-05-17 ENCOUNTER — Encounter: Payer: Self-pay | Admitting: *Deleted

## 2023-05-19 NOTE — Progress Notes (Unsigned)
No chief complaint on file.   Isabella Rodgers is a 52 y.o. female who presents for a complete physical and follow up on hypertension.   Seen recently with bee sting and LBP.  At that visit she mentioned wanting to change her BP medications.  She had only been taking amlodipine 2x/week due to edema.  Her edema improved since she cut back.  BP was normal at last visit. She reports BP's are running   She denies dizziness, headaches, edema (only after eating [place] food), chest pain, palpitations.   BP Readings from Last 3 Encounters:  04/22/23 120/78  04/19/22 128/82  05/10/21 138/88     Vitamin D deficiency:  Her last level was 46 in 04/2022, when taking 2000 IU daily.  She is currently taking 2000  IU daily.   H/o Iron deficiency anemia--had improved s/p ablation. Still had periods s/p ablation, but no longer heavy and no cramping. She stopped taking iron a few of years ago, and didn't have any recurrent anemia. Went 18 months without any cycle, until 01/01/20. Then had a couple of cycles after.  She hasn't had a period in over a year.   She does have hot flashes and night sweats, tolerable.   Lab Results  Component Value Date   WBC 5.8 04/19/2022   HGB 13.7 04/19/2022   HCT 42.5 04/19/2022   MCV 85 04/19/2022   PLT 268 04/19/2022   Obesity/weight gain--Last year had gained 12# from the year prior, and she reported that her problem is potatoes and bread.  Craves potatoes. She has gained additional weight since then. Thyroid was checked last year and was normal. Lab Results  Component Value Date   TSH 1.260 04/19/2022   Wt Readings from Last 3 Encounters:  04/22/23 223 lb 9.6 oz (101.4 kg)  04/19/22 214 lb 6.4 oz (97.3 kg)  05/10/21 202 lb (91.6 kg)     Immunization History  Administered Date(s) Administered   Influenza, Seasonal, Injecte, Preservative Fre 07/07/2012   Influenza,inj,Quad PF,6+ Mos 04/15/2014, 04/13/2015, 04/13/2021, 04/19/2022   Influenza-Unspecified  04/18/2020   PFIZER(Purple Top)SARS-COV-2 Vaccination 08/27/2019, 09/18/2019, 04/18/2020   PPD Test 02/10/2016   Pfizer Covid-19 Vaccine Bivalent Booster 24yrs & up 04/13/2021   Pfizer(Comirnaty)Fall Seasonal Vaccine 12 years and older 04/19/2022   Tdap 09/17/2012, 01/17/2018   Last Pap smear:  03/2022 normal, no high risk HPV Last mammogram: 09/2022 Last colonoscopy: 04/2020 with Dr. Adela Lank.  Adenomatous polyp, 7 year f/u recommended Last DEXA: never Dentist: twice yearly Ophtho: yearly (wears contacts/glasses) Exercise:   She gets at least an hour walk once a week (3+ miles).  Some walking if she has free time during the day. Teaches courses in the evenings. Has dumbbells and ankle weights at home, not using.  Lipids: Lab Results  Component Value Date   CHOL 185 04/19/2022   HDL 50 04/19/2022   LDLCALC 125 (H) 04/19/2022   TRIG 52 04/19/2022   CHOLHDL 3.7 04/19/2022     PMH, PSH, SH and FH reviewed    ROS:  The patient denies anorexia, fever, vision changes, decreased hearing, ear pain, sore throat, breast concerns, headaches, chest pain, palpitations, dizziness, syncope, dyspnea on exertion, cough, swelling, nausea, vomiting, diarrhea, abdominal pain, melena, hematochezia, indigestion/heartburn, hematuria, incontinence, dysuria, vaginal bleeding, discharge, odor or itch, genital lesions, joint pains, numbness, tingling, weakness, tremor, suspicious skin lesions, depression, anxiety, abnormal bleeding/bruising, or enlarged lymph nodes.  No cycles in over 2 years. Hot flashes/night sweats, tolerable No abnormal vaginal discharge,  dryness. No postcoital bleeding Occasional hemorrhoids (swelling/itching, no bleeding).  Occ constipation (r/b Mg tablets) Weight gain   PHYSICAL EXAM:  There were no vitals taken for this visit.   Wt Readings from Last 3 Encounters:  04/22/23 223 lb 9.6 oz (101.4 kg)  04/19/22 214 lb 6.4 oz (97.3 kg)  05/10/21 202 lb (91.6 kg)     General Appearance:     Alert, cooperative, no distress, appears stated age   Head:     Normocephalic, without obvious abnormality, atraumatic   Eyes:     PERRL, conjunctiva/corneas clear, EOM's intact, fundi benign   Ears:     Normal TM's and external ear canals   Nose:    No drainage or sinus tenderness  Throat:    Normal mucosa  Neck:    Supple, no lymphadenopathy;  thyroid:  borderline size, no nodules, nontender; no carotid bruit or JVD   Back:     Spine nontender, no curvature, ROM normal, no CVA tenderness   Lungs:      Clear to auscultation bilaterally without wheezes, rales or ronchi; respirations unlabored   Chest Wall:     No tenderness or deformity    Heart:     Regular rate and rhythm, S1 and S2 normal, no murmur, rub or gallop   Breast Exam:    No nipple inversion, discharge, skin dimpling, masses or axillary lymphadenopathy  Abdomen:      Soft, non-tender, nondistended, normoactive bowel sounds, no masses, no hepatosplenomegaly   Genitalia:   normal external genitalia, BUS and vagina normal. Uterus and adnexa normal, nontender, no mass. No abnormal discharge. No cervical motion tenderness. Pap not performed  Rectal:    Normal sphincter tone, no mass, heme negative stool  Extremities:    No clubbing, cyanosis or edema.  Pulses:    2+ and symmetric all extremities   Skin:    Skin color, texture, turgor normal, no rashes or suspicious lesions.  Lymph nodes:    Cervical, supraclavicular, inguinal and axillary nodes normal   Neurologic:    Normal strength, sensation and gait; reflexes 2+ and symmetric throughout                     Psych:   Normal mood, affect, hygiene and grooming.    ***borderline thyroid size??   ASSESSMENT/PLAN:  CBC only if periods (I believe she hasn't had a cycle in over a year, and not needed), or if any fatigue D only if not taking 2000 IU daily TSH only if sx other than weight gain (checked last year, normal, when had gained  weight)  Flu/covid Shingrix after 50  Discussed monthly self breast exams and yearly mammograms (past due, reminded to schedule); at least 30 minutes of aerobic activity at least 5 days/week, weight bearing exercise 2x/wk; proper sunscreen use reviewed; healthy diet, including goals of calcium and vitamin D intake and alcohol recommendations (less than or equal to 1 drink/day) reviewed; regular seatbelt use; changing batteries in smoke detectors.  Immunization recommendations discussed--continue yearly flu shots, given today. Bivalent COVID booster given today. Shingrix age 89, reviewed SE, can schedule NV at her convenience.  Colon cancer screening UTD, due again 04/2027  Healthy diet reviewed, weight loss encouraged.  F/u 1 year ***sooner? 6 mos? Sooner if BP meds changed

## 2023-05-19 NOTE — Patient Instructions (Incomplete)
  HEALTH MAINTENANCE RECOMMENDATIONS:  It is recommended that you get at least 30 minutes of aerobic exercise at least 5 days/week (for weight loss, you may need as much as 60-90 minutes). This can be any activity that gets your heart rate up. This can be divided in 10-15 minute intervals if needed, but try and build up your endurance at least once a week.  Weight bearing exercise is also recommended twice weekly.  Eat a healthy diet with lots of vegetables, fruits and fiber.  "Colorful" foods have a lot of vitamins (ie green vegetables, tomatoes, red peppers, etc).  Limit sweet tea, regular sodas and alcoholic beverages, all of which has a lot of calories and sugar.  Up to 1 alcoholic drink daily may be beneficial for women (unless trying to lose weight, watch sugars).  Drink a lot of water.  Calcium recommendations are 1200-1500 mg daily (1500 mg for postmenopausal women or women without ovaries), and vitamin D 1000 IU daily.  This should be obtained from diet and/or supplements (vitamins), and calcium should not be taken all at once, but in divided doses.  Monthly self breast exams and yearly mammograms for women over the age of 62 is recommended.  Sunscreen of at least SPF 30 should be used on all sun-exposed parts of the skin when outside between the hours of 10 am and 4 pm (not just when at beach or pool, but even with exercise, golf, tennis, and yard work!)  Use a sunscreen that says "broad spectrum" so it covers both UVA and UVB rays, and make sure to reapply every 1-2 hours.  Remember to change the batteries in your smoke detectors when changing your clock times in the spring and fall. Carbon monoxide detectors are recommended for your home.  Use your seat belt every time you are in a car, and please drive safely and not be distracted with cell phones and texting while driving.  I recommend getting the new shingles vaccine (Shingrix).   This is a series of 2 injections, spaced 2 months  apart.  It doesn't have to be exactly 2 months apart (but can't be sooner), if that isn't feasible for your schedule, but try and get them close to 2 months (and definitely within 6 months of each other, or else the efficacy of the vaccine drops off). This should be separated from other vaccines by at least 2 weeks.   Decrease the amlodipine to 5 mg (1 tablet) DAILY.  Your diastolic blood pressure has been running too high to continue what you are doing.  The swelling may be dose-dependent, so might improve with lowering the dose.  If it doesn't, we can add a diuretic to help with the swelling (and make further dose adjustments, if needed).  Please monitor your blood pressure regularly, record, and bring the list AND your monitor to a visit in 4 weeks to follow-up on these changes.  Continue to limit the sodium, and try and order wisely at restaurants.  I encourage you to incorporate some of what you learned/did during your sugar fast into a daily routine. Think about other ways you can make yourself feel better when you think of your dad and are sad. Consider counseling to help you figure these things out. You can consider a therapist, or grief counseling.

## 2023-05-20 ENCOUNTER — Ambulatory Visit (INDEPENDENT_AMBULATORY_CARE_PROVIDER_SITE_OTHER): Payer: BC Managed Care – PPO | Admitting: Family Medicine

## 2023-05-20 ENCOUNTER — Encounter: Payer: Self-pay | Admitting: Family Medicine

## 2023-05-20 VITALS — BP 122/84 | HR 72 | Ht 69.0 in | Wt 229.2 lb

## 2023-05-20 DIAGNOSIS — Z6833 Body mass index (BMI) 33.0-33.9, adult: Secondary | ICD-10-CM

## 2023-05-20 DIAGNOSIS — I10 Essential (primary) hypertension: Secondary | ICD-10-CM | POA: Diagnosis not present

## 2023-05-20 DIAGNOSIS — Z0000 Encounter for general adult medical examination without abnormal findings: Secondary | ICD-10-CM

## 2023-05-20 DIAGNOSIS — R635 Abnormal weight gain: Secondary | ICD-10-CM | POA: Diagnosis not present

## 2023-05-20 DIAGNOSIS — E559 Vitamin D deficiency, unspecified: Secondary | ICD-10-CM | POA: Diagnosis not present

## 2023-05-20 DIAGNOSIS — Z23 Encounter for immunization: Secondary | ICD-10-CM

## 2023-05-21 LAB — CMP14+EGFR
ALT: 19 [IU]/L (ref 0–32)
AST: 16 [IU]/L (ref 0–40)
Albumin: 4.4 g/dL (ref 3.9–4.9)
Alkaline Phosphatase: 109 [IU]/L (ref 44–121)
BUN/Creatinine Ratio: 19 (ref 9–23)
BUN: 15 mg/dL (ref 6–24)
Bilirubin Total: 0.3 mg/dL (ref 0.0–1.2)
CO2: 23 mmol/L (ref 20–29)
Calcium: 9.3 mg/dL (ref 8.7–10.2)
Chloride: 103 mmol/L (ref 96–106)
Creatinine, Ser: 0.79 mg/dL (ref 0.57–1.00)
Globulin, Total: 3.1 g/dL (ref 1.5–4.5)
Glucose: 91 mg/dL (ref 70–99)
Potassium: 4.5 mmol/L (ref 3.5–5.2)
Sodium: 142 mmol/L (ref 134–144)
Total Protein: 7.5 g/dL (ref 6.0–8.5)
eGFR: 92 mL/min/{1.73_m2} (ref >59)

## 2023-05-21 LAB — LIPID PANEL
Chol/HDL Ratio: 3.5 {ratio} (ref 0.0–4.4)
Cholesterol, Total: 179 mg/dL (ref 100–199)
HDL: 51 mg/dL (ref >39)
LDL Chol Calc (NIH): 113 mg/dL — ABNORMAL HIGH (ref 0–99)
Triglycerides: 80 mg/dL (ref 0–149)
VLDL Cholesterol Cal: 15 mg/dL (ref 5–40)

## 2023-05-21 LAB — TSH: TSH: 1.83 u[IU]/mL (ref 0.450–4.500)

## 2023-05-25 IMAGING — US US EXTREM LOW*L* LIMITED
1 series · 14 of 15 positions shown · non-contrast
Comparison: Radiographs 05/02/2021

CLINICAL DATA: Suspected glass foreign body in the lower leg.
Nonhealing wound.

EXAM:
ULTRASOUND left LOWER EXTREMITY LIMITED
TECHNIQUE: Ultrasound examination of the lower extremity soft tissues was
performed in the area of clinical concern.

[Series 1: us extrem low*left* limited · 0.06mm/px · 15 acquisitions, 14 frames shown]
[im 1/15]
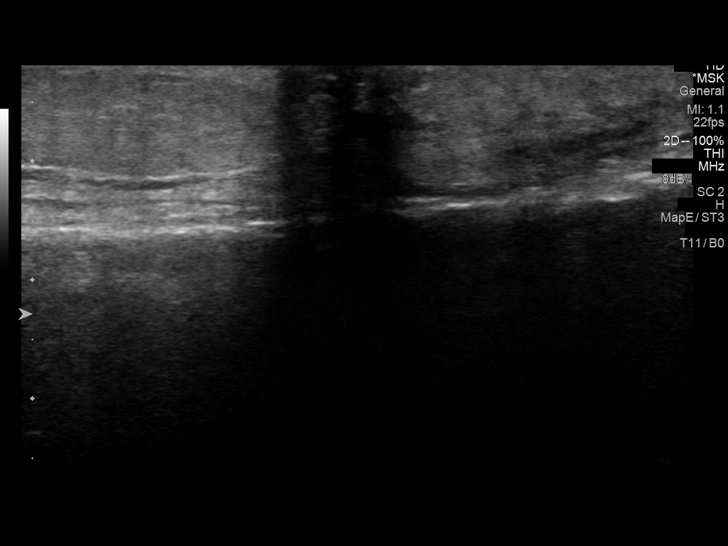
[im 2/15]
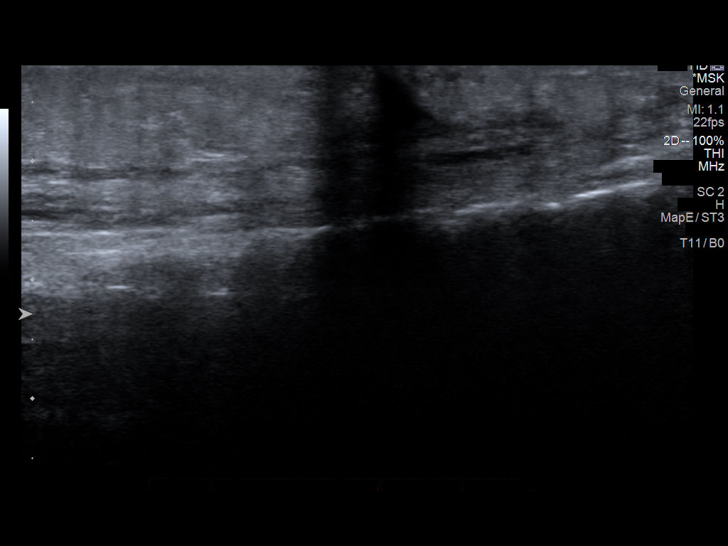
[im 3/15]
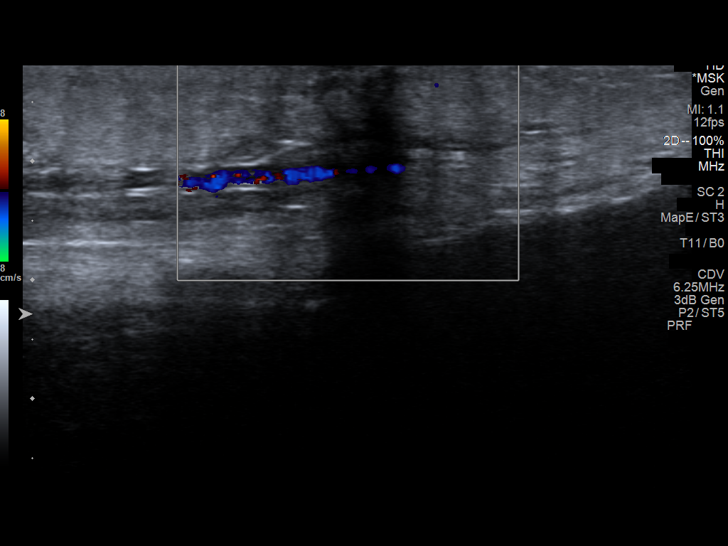
[im 4/15]
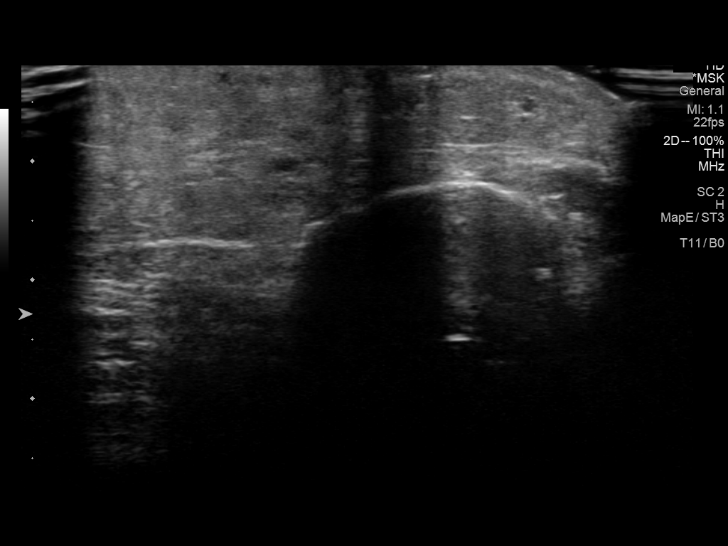
[im 5/15]
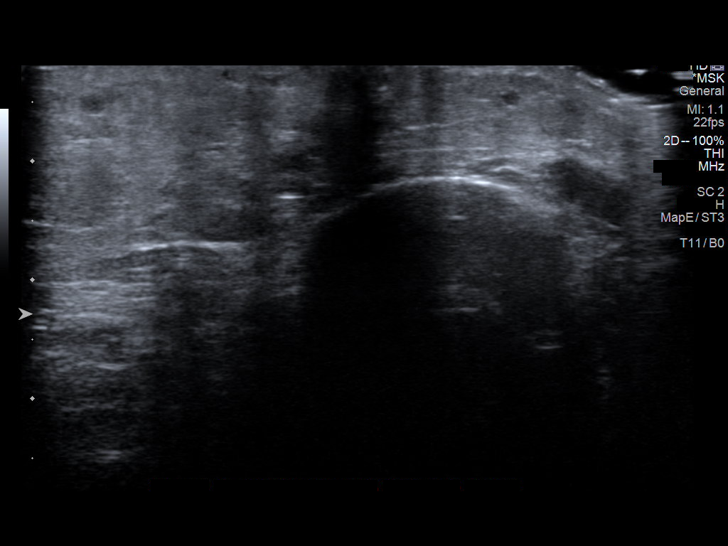
[im 6/15]
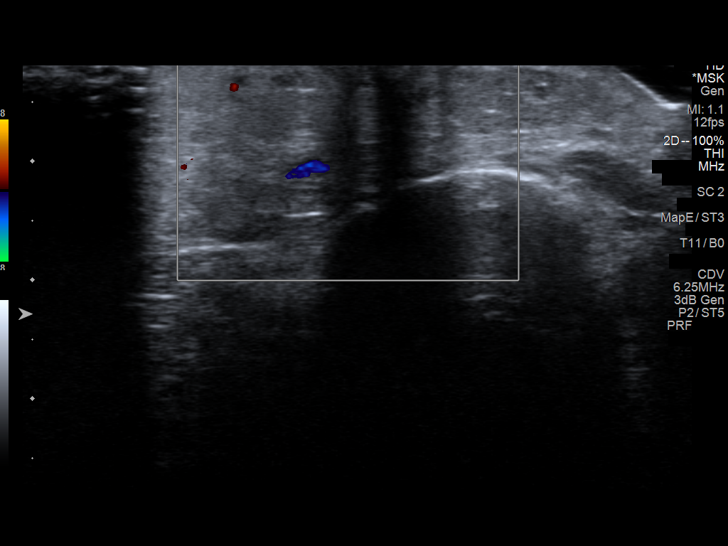
[im 7/15]
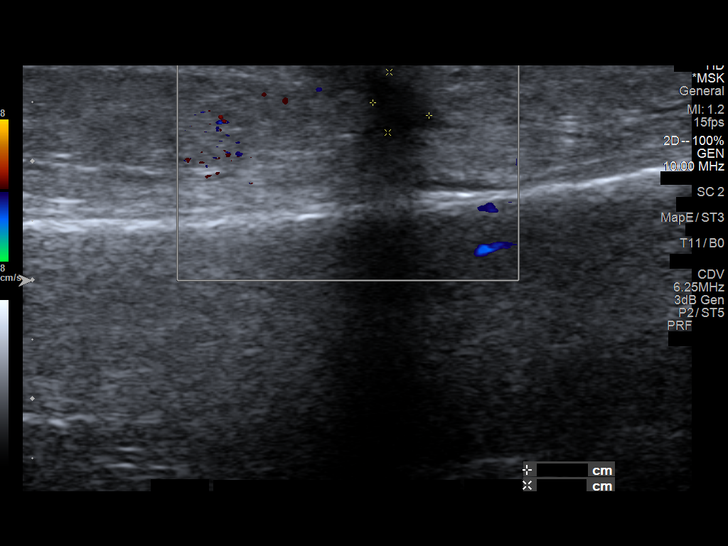
[im 9/15]
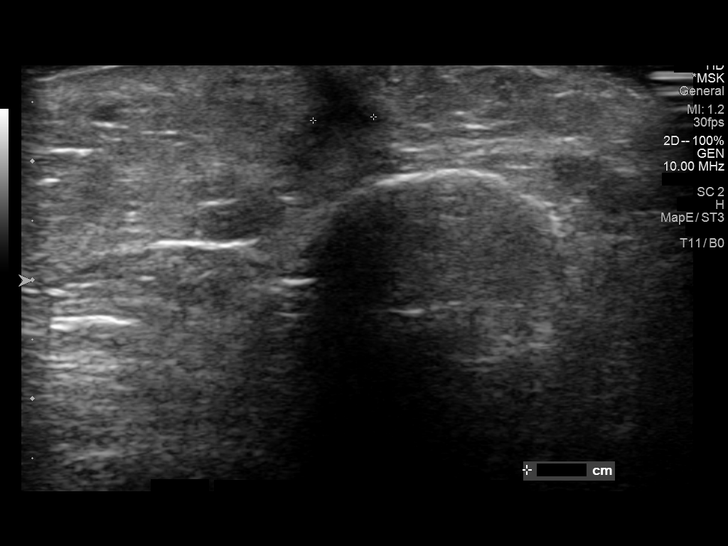
[im 10/15]
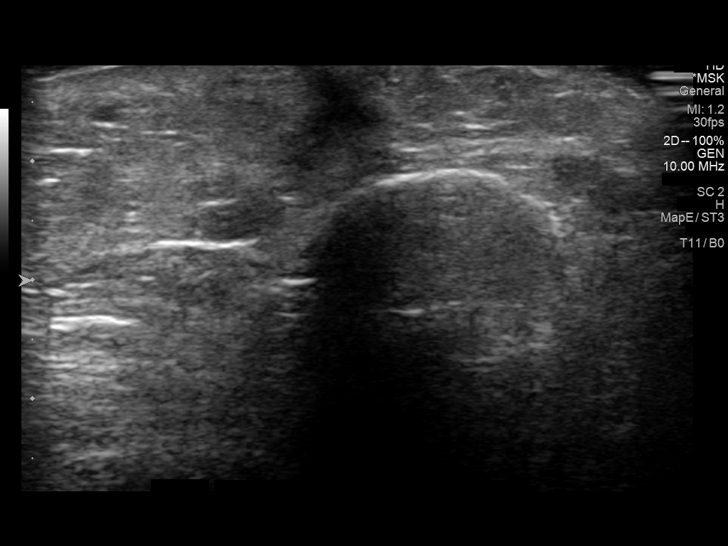
[im 11/15]
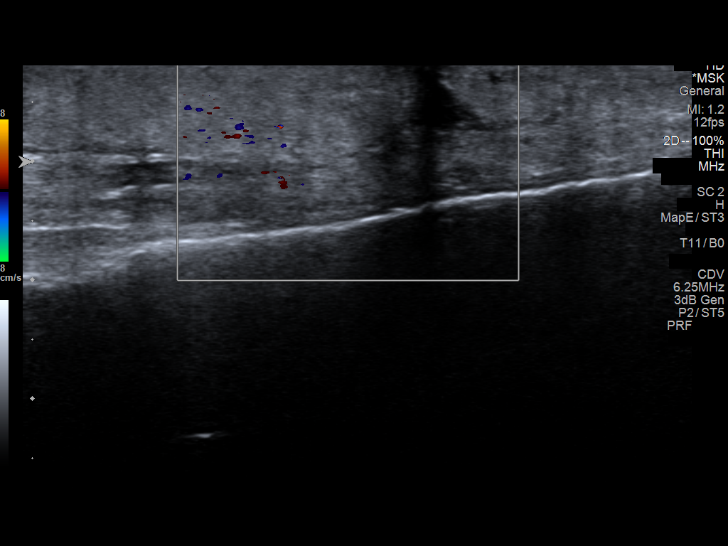
[im 12/15]
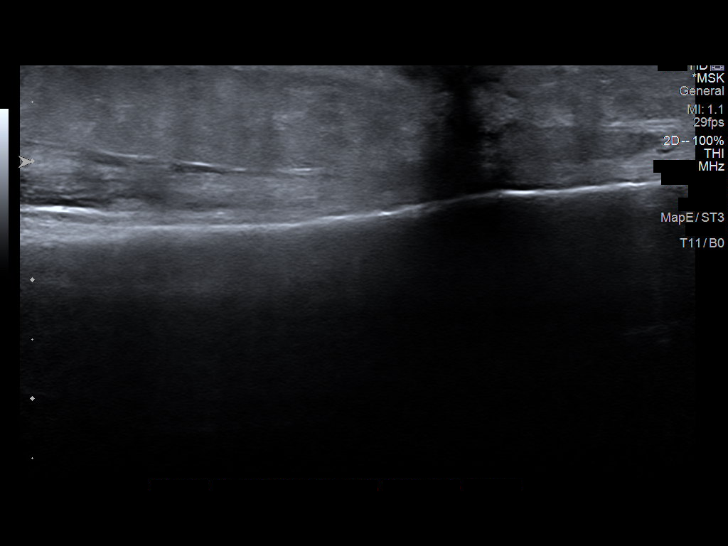
[im 13/15]
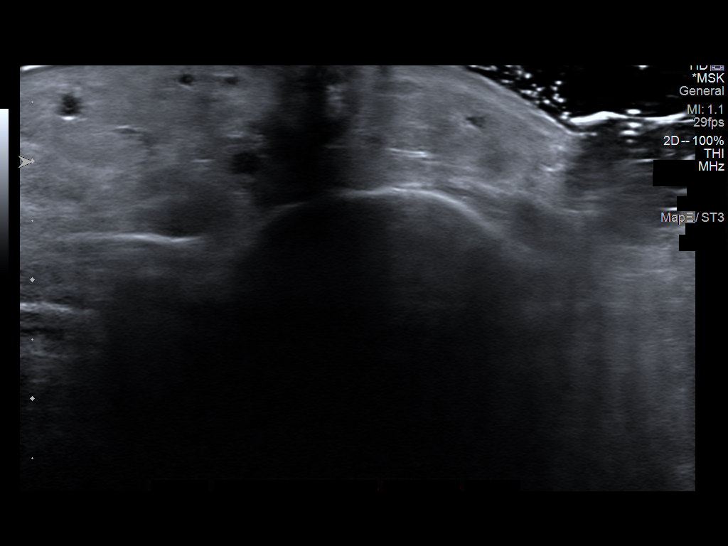
[im 14/15]
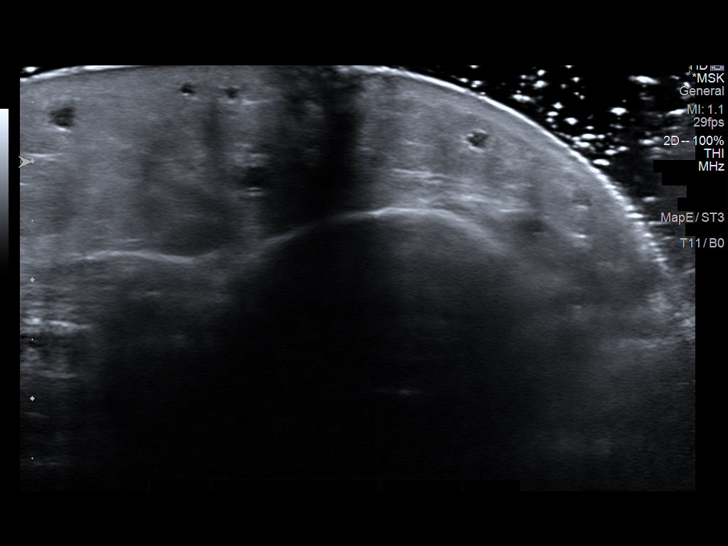
[im 15/15]
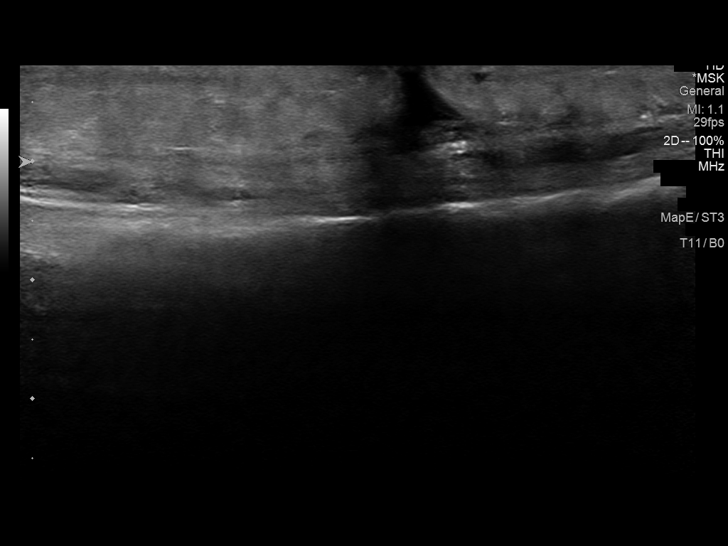

[14 of 15 positions shown; findings below may reference images not displayed]

FINDINGS: In the area of concern underlying the wound, there is a 0.5 by
by 0.5 cm (volume = 0.07 cm^3) immediately subcutaneous
hypoechogenicity. No definite foreign body is appreciated
sonographically.
IMPRESSION: 1. 0.5 cm focal region of hypoechogenicity beneath the wound site,
likely reflecting a subcutaneous component of the wound. No definite
foreign body is apparent sonographically.

## 2023-06-17 ENCOUNTER — Encounter: Payer: Self-pay | Admitting: *Deleted

## 2023-06-19 NOTE — Progress Notes (Deleted)
No chief complaint on file.  Patient presents for 1 month follow-up on hypertension. At her physical last month she had cut back on her amlodipine due to edema. She had only been taking 5 mg 2x/week, and her swelling improved. She had taken 5mg  the day of her visit. She recalled some BP's taken at home with diastolics up to 96. Prior dose had been 7.5mg  daily. On review of chart, she previously reported swelling only after eating [place] (but then reported more consistent swelling on the 7.5mg  dose, which is why she cut back). In 03/2021 her BP's weren't at goal on 5mg  amlodipine daily, but she had +life stressors.  She was advised in 04/2023 to resume taking 5 mg of amlodipine daily, and to limit the sodium in diet (discussed ordering wisely when eating out). She was advised to monitor BP regularly, bring list to the visit, and if swelling recurred, and/or if BP's were above goal, we could add diuretic.   BP's have been running       PMH, PSH, SH reviewed   ROS: no fever, chills, URI symptoms. No n/v/bowel changes (occ constipation), indigestion. No urinary complaints. No bleeding, bruising, rash. No headaches, dizziness, chest pain, shortness of breath or palpitations. Moods ar good. Edema per HPI   PHYSICAL EXAM:  There were no vitals taken for this visit.  Wt Readings from Last 3 Encounters:  05/20/23 229 lb 3.2 oz (104 kg)  04/22/23 223 lb 9.6 oz (101.4 kg)  04/19/22 214 lb 6.4 oz (97.3 kg)    Well developed, well nourished patient, in no distress HEENT: conjunctiva and sclera are clear, EOMI. Neck: No lymphadenopathy or thyromegaly, no carotid bruit Heart:  Regular rate and rhythm, no murmurs, rubs, gallops or ectopy Lungs:  Clear bilaterally, without wheezes, rales or ronchi Abdomen:  Soft, nontender, nondistended, no hepatosplenomegaly or masses, normal bowel sounds Extremities:  No clubbing, cyanosis or edema, 2+ pulses.  Neuro:  Alert and oriented x 3, cranial  nerves grossly intact.  Normal gait. Back:  No spine or CVA tenderness Skin: no rashes or suspicious lesions Psych:  Normal mood, affect, hygiene and grooming, normal speech, eye contact     ASSESSMENT/PLAN:  Does she want Shingrix today? She should be bringing her BP monitor to visit today (along with list of BP's) Please have her check with her monitor (and ensure she has arm at proper height, mimicking how she checks at home). Repeat her BP afterwards. Please leave sticky with both values so I can see if monitor is accurate and document in her chart so I know for future  For ME: Will need new amlodipine rx to reflect proper dosing (last filled 03/2022)

## 2023-06-20 ENCOUNTER — Encounter: Payer: BC Managed Care – PPO | Admitting: Family Medicine

## 2023-06-20 DIAGNOSIS — I10 Essential (primary) hypertension: Secondary | ICD-10-CM

## 2023-10-15 ENCOUNTER — Other Ambulatory Visit: Payer: Self-pay | Admitting: Family Medicine

## 2023-10-15 DIAGNOSIS — Z1231 Encounter for screening mammogram for malignant neoplasm of breast: Secondary | ICD-10-CM

## 2023-10-23 ENCOUNTER — Ambulatory Visit: Payer: Self-pay

## 2024-06-03 ENCOUNTER — Encounter: Payer: BC Managed Care – PPO | Admitting: Family Medicine

## 2024-06-23 NOTE — Progress Notes (Unsigned)
 No chief complaint on file.  Patient presents for follow up from ***  ??    Patient has been seen since 04/2023 physical. She cancelled her 1 month f/u visit, and also cancelled her 05/2024 physical (and hasn't rescheduled).  At that visit, she discussed her concerns about edema from her amlodipine .  She had decreased dose from 7.5 mg aily, and at that visit had been taking 5 mg just a couple of times/week.  She was supposed to take amlodipine  5 mg daily, monitor BP and f/u in 4 weeks.  (To consider adding hydrochlorothiazide if BP not controlled or ongoing edema).  BP Readings from Last 3 Encounters:  05/20/23 122/84  04/22/23 120/78  04/19/22 128/82    We had also discussed her weight gain, with emotional eating.  She had reported eating whenever she thinks of her dad (passed away in 2020-07-29), craves potatoes and bread. She reported these times come out of the blue, and eating makes her feel better. She was encouraged to get grief counseling or individual therapy, as well as seek alternative actions, to refocus, to find other ways to feel better.  Wt Readings from Last 3 Encounters:  05/20/23 229 lb 3.2 oz (104 kg)  04/22/23 223 lb 9.6 oz (101.4 kg)  04/19/22 214 lb 6.4 oz (97.3 kg)       PMH, PSH, SH reviewed   ROS:    PHYSICAL EXAM:  There were no vitals taken for this visit.  Wt Readings from Last 3 Encounters:  05/20/23 229 lb 3.2 oz (104 kg)  04/22/23 223 lb 9.6 oz (101.4 kg)  04/19/22 214 lb 6.4 oz (97.3 kg)       ASSESSMENT/PLAN:  I see no ER or hospital records--hopefully she is bringing paperwork?? Maybe it'll show up in care everywhere once she checks in??  ?out of meds for HTN?  Needs flu and COVID booster (if not sick--no idea what this visit is for). Will also need prevnar, but we can give that at her physical.   Needs to r/s CPE

## 2024-06-24 ENCOUNTER — Ambulatory Visit: Admitting: Family Medicine

## 2024-06-24 ENCOUNTER — Encounter: Payer: Self-pay | Admitting: Family Medicine

## 2024-06-24 VITALS — BP 140/92 | HR 68 | Temp 98.6°F | Resp 18 | Ht 69.0 in | Wt 224.0 lb

## 2024-06-24 DIAGNOSIS — I10 Essential (primary) hypertension: Secondary | ICD-10-CM

## 2024-06-24 DIAGNOSIS — R519 Headache, unspecified: Secondary | ICD-10-CM | POA: Diagnosis not present

## 2024-06-24 DIAGNOSIS — Z7185 Encounter for immunization safety counseling: Secondary | ICD-10-CM

## 2024-06-24 DIAGNOSIS — R899 Unspecified abnormal finding in specimens from other organs, systems and tissues: Secondary | ICD-10-CM

## 2024-06-24 MED ORDER — AMLODIPINE BESYLATE 5 MG PO TABS: 5.0000 mg | ORAL_TABLET | Freq: Every day | ORAL | 0 refills | Status: AC

## 2024-06-24 MED ORDER — HYDROCHLOROTHIAZIDE 12.5 MG PO CAPS: 12.5000 mg | ORAL_CAPSULE | Freq: Every day | ORAL | 0 refills | Status: AC

## 2024-06-24 NOTE — Patient Instructions (Signed)
 Take 5 mg of amlodipine  daily (just 1 tablet, not 1.5). Start taking hydrochlorothiazide 12.5 mg once daily in the morning. If after 7-10 days your blood pressure is still >140/90, increase the hydrochlorothiazide to 25 mg (take 2 capsules daily).  Continue the amlodipine  at 5 mg. Alternatively, you could continue on the 12.5 mg hydrochlorothiazide and increase the amlodipine  back to 1.5 tablets daily. You can make this decision based on how much swelling you have, and if the lower dose of the hydrochlorothiazide is causing a lot of urinary frequency. (You only need to make that decision if your blood pressure is not at goal on the 1 pill of each).  It is important to stay well hydrated, since you will be taking a diuretic. If you get dizzy with standing, you likely need to be drinking more water (especially if your blood pressure isn't low enough to cause the dizziness).  The other vaccine that you will need at some point is for pneumonia--either Prevnar-20 or Capvaxive-21.
